# Patient Record
Sex: Female | Born: 1950 | Race: White | Hispanic: No | Marital: Married | Smoking: Former smoker
Health system: Southern US, Community
[De-identification: ages and names within clinical notes are randomized; demographics above are authoritative.]

## PROBLEM LIST (undated history)

## (undated) DIAGNOSIS — E7800 Pure hypercholesterolemia, unspecified: Secondary | ICD-10-CM

## (undated) DIAGNOSIS — I10 Essential (primary) hypertension: Secondary | ICD-10-CM

## (undated) DIAGNOSIS — T7840XA Allergy, unspecified, initial encounter: Secondary | ICD-10-CM

## (undated) HISTORY — DX: Allergy, unspecified, initial encounter: T78.40XA

## (undated) HISTORY — PX: POLYPECTOMY: SHX149

## (undated) HISTORY — PX: CHOLECYSTECTOMY: SHX55

## (undated) HISTORY — PX: ABDOMINAL HYSTERECTOMY: SHX81

---

## 1992-08-24 ENCOUNTER — Encounter (INDEPENDENT_AMBULATORY_CARE_PROVIDER_SITE_OTHER): Payer: Self-pay | Admitting: *Deleted

## 1998-11-13 ENCOUNTER — Encounter: Admission: RE | Admit: 1998-11-13 | Discharge: 1998-11-13 | Payer: Self-pay | Admitting: Family Medicine

## 2000-06-16 ENCOUNTER — Encounter: Admission: RE | Admit: 2000-06-16 | Discharge: 2000-06-16 | Payer: Self-pay | Admitting: Family Medicine

## 2000-06-27 ENCOUNTER — Encounter: Admission: RE | Admit: 2000-06-27 | Discharge: 2000-06-27 | Payer: Self-pay | Admitting: Family Medicine

## 2000-07-19 ENCOUNTER — Encounter: Admission: RE | Admit: 2000-07-19 | Discharge: 2000-07-19 | Payer: Self-pay | Admitting: Family Medicine

## 2000-10-05 ENCOUNTER — Encounter: Admission: RE | Admit: 2000-10-05 | Discharge: 2000-10-05 | Payer: Self-pay | Admitting: Family Medicine

## 2000-10-20 ENCOUNTER — Encounter: Admission: RE | Admit: 2000-10-20 | Discharge: 2000-10-20 | Payer: Self-pay | Admitting: Family Medicine

## 2001-12-19 ENCOUNTER — Encounter: Admission: RE | Admit: 2001-12-19 | Discharge: 2001-12-19 | Payer: Self-pay | Admitting: Family Medicine

## 2001-12-21 ENCOUNTER — Encounter: Admission: RE | Admit: 2001-12-21 | Discharge: 2001-12-21 | Payer: Self-pay | Admitting: Family Medicine

## 2001-12-28 ENCOUNTER — Encounter: Admission: RE | Admit: 2001-12-28 | Discharge: 2001-12-28 | Payer: Self-pay | Admitting: Family Medicine

## 2003-12-16 ENCOUNTER — Ambulatory Visit (HOSPITAL_COMMUNITY): Admission: RE | Admit: 2003-12-16 | Discharge: 2003-12-16 | Payer: Self-pay | Admitting: General Surgery

## 2003-12-16 ENCOUNTER — Encounter (INDEPENDENT_AMBULATORY_CARE_PROVIDER_SITE_OTHER): Payer: Self-pay | Admitting: *Deleted

## 2004-03-26 ENCOUNTER — Encounter: Admission: RE | Admit: 2004-03-26 | Discharge: 2004-03-26 | Payer: Self-pay | Admitting: Family Medicine

## 2004-05-07 ENCOUNTER — Encounter: Admission: RE | Admit: 2004-05-07 | Discharge: 2004-05-07 | Payer: Self-pay | Admitting: Family Medicine

## 2004-06-14 ENCOUNTER — Encounter: Admission: RE | Admit: 2004-06-14 | Discharge: 2004-06-14 | Payer: Self-pay | Admitting: Family Medicine

## 2004-06-23 ENCOUNTER — Encounter: Admission: RE | Admit: 2004-06-23 | Discharge: 2004-06-23 | Payer: Self-pay | Admitting: Sports Medicine

## 2004-06-25 ENCOUNTER — Encounter: Admission: RE | Admit: 2004-06-25 | Discharge: 2004-06-25 | Payer: Self-pay | Admitting: Sports Medicine

## 2004-08-28 ENCOUNTER — Emergency Department (HOSPITAL_COMMUNITY): Admission: EM | Admit: 2004-08-28 | Discharge: 2004-08-28 | Payer: Self-pay | Admitting: Family Medicine

## 2004-08-30 ENCOUNTER — Ambulatory Visit: Payer: Self-pay | Admitting: Family Medicine

## 2004-10-08 ENCOUNTER — Ambulatory Visit: Payer: Self-pay | Admitting: Family Medicine

## 2005-01-11 ENCOUNTER — Ambulatory Visit: Payer: Self-pay | Admitting: Family Medicine

## 2005-01-11 ENCOUNTER — Ambulatory Visit (HOSPITAL_COMMUNITY): Admission: RE | Admit: 2005-01-11 | Discharge: 2005-01-11 | Payer: Self-pay | Admitting: Family Medicine

## 2005-02-07 ENCOUNTER — Ambulatory Visit: Payer: Self-pay | Admitting: Sports Medicine

## 2005-02-07 ENCOUNTER — Encounter: Admission: RE | Admit: 2005-02-07 | Discharge: 2005-02-07 | Payer: Self-pay | Admitting: Family Medicine

## 2005-02-28 ENCOUNTER — Ambulatory Visit: Payer: Self-pay | Admitting: Family Medicine

## 2005-03-23 ENCOUNTER — Ambulatory Visit: Payer: Self-pay | Admitting: Family Medicine

## 2005-12-12 ENCOUNTER — Ambulatory Visit: Payer: Self-pay | Admitting: Family Medicine

## 2006-05-15 ENCOUNTER — Ambulatory Visit: Payer: Self-pay | Admitting: Family Medicine

## 2006-08-16 ENCOUNTER — Emergency Department (HOSPITAL_COMMUNITY): Admission: EM | Admit: 2006-08-16 | Discharge: 2006-08-17 | Payer: Self-pay | Admitting: Emergency Medicine

## 2006-12-21 DIAGNOSIS — I10 Essential (primary) hypertension: Secondary | ICD-10-CM | POA: Insufficient documentation

## 2006-12-21 DIAGNOSIS — Z87891 Personal history of nicotine dependence: Secondary | ICD-10-CM | POA: Insufficient documentation

## 2006-12-21 DIAGNOSIS — M545 Low back pain, unspecified: Secondary | ICD-10-CM | POA: Insufficient documentation

## 2006-12-21 DIAGNOSIS — J309 Allergic rhinitis, unspecified: Secondary | ICD-10-CM | POA: Insufficient documentation

## 2006-12-21 DIAGNOSIS — Z86010 Personal history of colonic polyps: Secondary | ICD-10-CM | POA: Insufficient documentation

## 2006-12-22 ENCOUNTER — Encounter (INDEPENDENT_AMBULATORY_CARE_PROVIDER_SITE_OTHER): Payer: Self-pay | Admitting: *Deleted

## 2007-02-28 ENCOUNTER — Telehealth: Payer: Self-pay | Admitting: Family Medicine

## 2007-05-28 ENCOUNTER — Ambulatory Visit: Payer: Self-pay | Admitting: Sports Medicine

## 2007-05-28 ENCOUNTER — Encounter: Payer: Self-pay | Admitting: Family Medicine

## 2007-05-28 DIAGNOSIS — L821 Other seborrheic keratosis: Secondary | ICD-10-CM | POA: Insufficient documentation

## 2007-05-28 DIAGNOSIS — R1013 Epigastric pain: Secondary | ICD-10-CM | POA: Insufficient documentation

## 2007-05-28 LAB — CONVERTED CEMR LAB
AST: 16 units/L (ref 0–37)
Alkaline Phosphatase: 66 units/L (ref 39–117)
BUN: 9 mg/dL (ref 6–23)
Creatinine, Ser: 0.85 mg/dL (ref 0.40–1.20)
HCT: 48.8 % — ABNORMAL HIGH (ref 36.0–46.0)
HDL: 38 mg/dL — ABNORMAL LOW (ref >39)
Hemoglobin: 16.3 g/dL — ABNORMAL HIGH (ref 12.0–15.0)
LDL Cholesterol: 105 mg/dL — ABNORMAL HIGH (ref 0–99)
MCHC: 33.4 g/dL (ref 30.0–36.0)
RDW: 14.9 % — ABNORMAL HIGH (ref 11.5–14.0)
Total CHOL/HDL Ratio: 5.1

## 2007-05-30 ENCOUNTER — Encounter: Admission: RE | Admit: 2007-05-30 | Discharge: 2007-05-30 | Payer: Self-pay | Admitting: Family Medicine

## 2007-05-30 ENCOUNTER — Telehealth: Payer: Self-pay | Admitting: Family Medicine

## 2007-06-11 ENCOUNTER — Telehealth: Payer: Self-pay | Admitting: Family Medicine

## 2007-07-27 ENCOUNTER — Ambulatory Visit: Payer: Self-pay | Admitting: Family Medicine

## 2007-09-12 ENCOUNTER — Telehealth: Payer: Self-pay | Admitting: *Deleted

## 2007-09-12 ENCOUNTER — Ambulatory Visit: Payer: Self-pay | Admitting: Family Medicine

## 2007-09-18 ENCOUNTER — Ambulatory Visit: Payer: Self-pay | Admitting: Family Medicine

## 2007-10-19 ENCOUNTER — Ambulatory Visit: Payer: Self-pay | Admitting: Family Medicine

## 2007-10-19 DIAGNOSIS — M5412 Radiculopathy, cervical region: Secondary | ICD-10-CM | POA: Insufficient documentation

## 2008-06-27 ENCOUNTER — Ambulatory Visit: Payer: Self-pay | Admitting: Family Medicine

## 2008-06-27 DIAGNOSIS — J449 Chronic obstructive pulmonary disease, unspecified: Secondary | ICD-10-CM | POA: Insufficient documentation

## 2008-06-27 LAB — CONVERTED CEMR LAB
Calcium: 9.4 mg/dL (ref 8.4–10.5)
Sodium: 140 meq/L (ref 135–145)

## 2008-07-22 ENCOUNTER — Telehealth: Payer: Self-pay | Admitting: *Deleted

## 2008-07-30 ENCOUNTER — Ambulatory Visit: Payer: Self-pay | Admitting: Family Medicine

## 2008-08-01 ENCOUNTER — Encounter: Payer: Self-pay | Admitting: *Deleted

## 2009-06-24 ENCOUNTER — Ambulatory Visit: Payer: Self-pay | Admitting: Family Medicine

## 2009-06-24 DIAGNOSIS — R5381 Other malaise: Secondary | ICD-10-CM | POA: Insufficient documentation

## 2009-06-24 DIAGNOSIS — R5383 Other fatigue: Secondary | ICD-10-CM

## 2009-06-24 LAB — CONVERTED CEMR LAB
ALT: 9 units/L (ref 0–35)
Albumin: 4.1 g/dL (ref 3.5–5.2)
BUN: 12 mg/dL (ref 6–23)
CO2: 24 meq/L (ref 19–32)
Calcium: 9.3 mg/dL (ref 8.4–10.5)
Chloride: 103 meq/L (ref 96–112)
Creatinine, Ser: 0.97 mg/dL (ref 0.40–1.20)
HCT: 46.9 % — ABNORMAL HIGH (ref 36.0–46.0)
Hemoglobin: 15.6 g/dL — ABNORMAL HIGH (ref 12.0–15.0)
Platelets: 257 10*3/uL (ref 150–400)
TSH: 0.889 microintl units/mL (ref 0.350–4.500)
WBC: 8.4 10*3/uL (ref 4.0–10.5)

## 2009-06-30 ENCOUNTER — Encounter: Admission: RE | Admit: 2009-06-30 | Discharge: 2009-06-30 | Payer: Self-pay | Admitting: Family Medicine

## 2009-07-03 ENCOUNTER — Ambulatory Visit: Payer: Self-pay | Admitting: Internal Medicine

## 2009-07-08 ENCOUNTER — Telehealth: Payer: Self-pay | Admitting: Internal Medicine

## 2009-07-16 ENCOUNTER — Ambulatory Visit: Payer: Self-pay | Admitting: Internal Medicine

## 2009-07-16 ENCOUNTER — Encounter: Payer: Self-pay | Admitting: Internal Medicine

## 2009-07-18 ENCOUNTER — Encounter: Payer: Self-pay | Admitting: Internal Medicine

## 2009-11-30 ENCOUNTER — Telehealth: Payer: Self-pay | Admitting: Internal Medicine

## 2009-11-30 ENCOUNTER — Telehealth: Payer: Self-pay | Admitting: Family Medicine

## 2009-12-14 ENCOUNTER — Telehealth: Payer: Self-pay | Admitting: Family Medicine

## 2009-12-31 ENCOUNTER — Telehealth: Payer: Self-pay | Admitting: Family Medicine

## 2009-12-31 ENCOUNTER — Ambulatory Visit: Payer: Self-pay | Admitting: Family Medicine

## 2010-05-07 ENCOUNTER — Ambulatory Visit (HOSPITAL_COMMUNITY): Admission: RE | Admit: 2010-05-07 | Discharge: 2010-05-07 | Payer: Self-pay | Admitting: Family Medicine

## 2010-05-07 ENCOUNTER — Ambulatory Visit: Payer: Self-pay | Admitting: Family Medicine

## 2010-05-07 LAB — CONVERTED CEMR LAB
ALT: 16 units/L (ref 0–35)
AST: 19 units/L (ref 0–37)
Albumin: 4.2 g/dL (ref 3.5–5.2)
BUN: 10 mg/dL (ref 6–23)
Calcium: 9.7 mg/dL (ref 8.4–10.5)
Chloride: 104 meq/L (ref 96–112)
Hemoglobin: 15.5 g/dL — ABNORMAL HIGH (ref 12.0–15.0)
LDL Cholesterol: 97 mg/dL (ref 0–99)
Potassium: 3.8 meq/L (ref 3.5–5.3)
RDW: 14 % (ref 11.5–15.5)
Sodium: 141 meq/L (ref 135–145)
TSH: 0.752 microintl units/mL (ref 0.350–4.500)
Total Protein: 7.1 g/dL (ref 6.0–8.3)

## 2010-05-19 ENCOUNTER — Encounter: Payer: Self-pay | Admitting: Family Medicine

## 2010-11-13 ENCOUNTER — Encounter: Payer: Self-pay | Admitting: Cardiovascular Disease

## 2010-11-23 NOTE — Progress Notes (Signed)
Summary: meds prob  Phone Note Call from Patient Call back at Home Phone [phone]   Caller: Patient Summary of Call: Enalap/hctz 5-12 5mg  tabs - was told that manufacturer has discontinued this med and needs to see what else she needs to take.  [facility] Initial call taken by: De Nurse,  December 14, 2009 8:53 AM  Follow-up for Phone Call        New Rx sent and patient informed Follow-up by: Doralee Albino MD,  December 14, 2009 11:04 AM    New/Updated Medications: ENALAPRIL MALEATE 10 MG TABS (ENALAPRIL MALEATE) one by mouth daily Prescriptions: ENALAPRIL MALEATE 10 MG TABS (ENALAPRIL MALEATE) one by mouth daily  #90 x 3   Entered and Authorized by:   Doralee Albino MD   Signed by:   Doralee Albino MD on 12/14/2009   Method used:   Electronically to        [facility] (retail)       [address]       [place], [place]  [address]       Ph: [phone]       Fax: [phone]   RxID:   [id number]

## 2010-11-23 NOTE — Progress Notes (Signed)
Summary: triage  Phone Note Call from Patient Call back at Home Phone [phone]   Caller: Patient Summary of Call: having stomach issues/pain - needs to talk to nurse Initial call taken by: De Nurse,  November 30, 2009 1:38 PM  Follow-up for Phone Call        c/o being uncomfortable "near my gallbladder, but it has been taken out" had colonoscopy by Dr. Juanda Chance. had 4 polyps removed. off & on  pain on R. had this problem when she had the GB surgery. happens when she moves a certain way & sometimes when she is sitting still. wants to see md today. told her we had no appts. she is going to call Dr. Juanda Chance & if no appt there will go to the [facility] she has used before  had colonoscopy 9/10 by Dr. Juanda Chance. had 4 polyps removed. Follow-up by: Golden Circle RN,  November 30, 2009 2:00 PM  Additional Follow-up for Phone Call Additional follow up Details #1::        noted and agree Additional Follow-up by: Doralee Albino MD,  December 01, 2009 9:18 AM

## 2010-11-23 NOTE — Assessment & Plan Note (Signed)
Summary: leg cramps and dry cough and HTN   Vital Signs:  Patient profile:   60 year old female Height:      68.5 inches Weight:      189 pounds BMI:     28.42 Temp:     98.2 degrees F Pulse rate:   56 / minute BP sitting:   160 / 92  Vitals Entered By: Golden Circle RN (December 31, 2009 1:31 PM)  Primary Care Provider:  Doralee Albino MD  CC:  leg cramps and cough.  History of Present Illness: Leg Cramps: Pt has had 6 days and nights of leg cramps. It started about 8-9 days after starting Enalipril 10 mg. She had been on Enalapril/HCTZ 5-12.5 prior to this and had been doing well. However now with the higher dose the patient feels like she is having leg cramps and swollen ankles.   Cough: Pt has a grandson that has been visiting her lately and was sick. She now has had a dry cough for more than 1 week and has occaional left ear shooting pain. She had a sore throat and a runny nose (now both resolved) but continues to have a dry cough. The dry cough developed about the same time the leg cramps did and she wonders if it is also related to the increase in Enalapril.  hypertension: Pt has elevated BP today. She is taking the Metoprolol but is not taking the Enalapril today. It had been well controlled before she switched meds.   Habits & Providers  Alcohol-Tobacco-Diet     Alcohol drinks/day: 0     Tobacco Status: current     Tobacco Counseling: to quit use of tobacco products     Cigarette Packs/Day: 0.5     Year Started: age 34  Exercise-Depression-Behavior     Does Patient Exercise: yes     Type of exercise: walk     Times/week: 7     Have you felt down or hopeless? no     Have you felt little pleasure in things? no     Depression Counseling: not indicated; screening negative for depression     Drug Use: never     Seat Belt Use: always  Current Medications (verified): 1)  Bayer Aspirin 325 Mg Tabs (Aspirin) .... Take 1 Tablet By Mouth Once A Day 2)  Lorazepam 1 Mg  Tabs (Lorazepam) .... One By Mouth At Bedtime As Needed Neck Pain/spasm 3)  Metoprolol Tartrate 50 Mg Tabs (Metoprolol Tartrate) .... Take 1 Tablet By Mouth Twice A Day 4)  Sm Calcium/vitamin D 500-200 Mg-Unit Tabs (Calcium Carbonate-Vitamin D) .... Take 1 Tablet By Mouth Three Times A Day 5)  Guaifenesin-Codeine 100-10 Mg/47ml Syrp (Guaifenesin-Codeine) .... One Teaspoon Q4h As Needed Cough.  Disp 8 Oz 6)  Enalapril-Hydrochlorothiazide 5-12.5 Mg Tabs (Enalapril-Hydrochlorothiazide) .... Take One Pill Daily For High Blood Pressure  Allergies (verified): 1)  ! Amoxicillin 2)  ! Metronidazole (Metronidazole) 3)  ! Famotidine (Famotidine)  Social History: Does Patient Exercise:  yes Packs/Day:  0.5  Review of Systems        vitals reviewed and pertinent negatives and positives seen in HPI   Physical Exam  General:  Well-developed,well-nourished,in no acute distress but does not appear as if she feels well; alert,appropriate and cooperative throughout examination Head:  Normocephalic and atraumatic without obvious abnormalities. No apparent alopecia or balding. Ears:  left ear has some purulance behind it but has normal light reflex and no bulging. Rt ear has normal  TM. No erythema in canal.  Nose:  External nasal examination shows no deformity or inflammation. Nasal mucosa are pink and moist without lesions or exudates. Mouth:  some erythema in posterior pharynx, no purulance Neck:  No deformities, masses, or tenderness noted. No LAD Lungs:  Normal respiratory effort, chest expands symmetrically. Lungs are clear to auscultation, no crackles or wheezes. Heart:  Normal rate and regular rhythm. S1 and S2 normal without gallop, murmur, click, rub or other extra sounds. Extremities:  trace edema in bilateral lower legs.  Cervical Nodes:  No lymphadenopathy noted   Impression & Recommendations:  Problem # 1:  LEG CRAMPS (ICD-729.82) Assessment New Pt believes that her cramps are due to  her increased dose of Enalipril. She had been on Enalapril for a while and did have an increase in dose that seh started taking on 12-15-09. She has been having leg cramps for the last 6 days. The Rt leg cramps more than the left. It happens more at night than during the day.   Orders: [facility]- Est  Level 4 (14782)  Problem # 2:  DRY COUGH (ICD-786.2) Assessment: New Pt has had a dry cough for over a week now. She is concerned that it is also being caused by the ACE-I. She has also had a viral illness lately (probably contracted from her grandson). Plan to treat cough with Guaifenesin-Codeine and put her back on her old dose on Enalapril 5 mg. It was not causing any cough. If pt's cough persists RTC to see PCP.   Orders: [facility]- Est  Level 4 (95621)  Problem # 3:  HYPERTENSION, BENIGN SYSTEMIC (ICD-401.1) Assessment: Deteriorated Pt was placed back on her old regimine of BP meds. She has elevated BP today in the clinic but this is expected since she is only taking Metoprolol.   The following medications were removed from the medication list:    Enalapril Maleate 10 Mg Tabs (Enalapril maleate) ..... One by mouth daily Her updated medication list for this problem includes:    Metoprolol Tartrate 50 Mg Tabs (Metoprolol tartrate) .[facility]... Take 1 tablet by mouth twice a day    Enalapril-hydrochlorothiazide 5-12.5 Mg Tabs (Enalapril-hydrochlorothiazide) .Marland Kitchen... Take one pill daily for high blood pressure  Orders: [facility]- Est  Level 4 ([address]214)  Complete Medication List: 1)  Bayer Aspirin 325 Mg Tabs (Aspirin) .... Take 1 tablet by mouth once a day 2)  Lorazepam 1 Mg Tabs (Lorazepam) .... One by mouth at bedtime as needed neck pain/spasm 3)  Metoprolol Tartrate 50 Mg Tabs (Metoprolol tartrate) .... Take 1 tablet by mouth twice a day 4)  Sm Calcium/vitamin D 500-200 Mg-unit Tabs (Calcium carbonate-vitamin d) .... Take 1 tablet by mouth three times a day 5)  Guaifenesin-codeine 100-10 Mg/79ml Syrp  (Guaifenesin-codeine) .... One teaspoon q4h as needed cough.  disp 8 oz 6)  Enalapril-hydrochlorothiazide 5-12.5 Mg Tabs (Enalapril-hydrochlorothiazide) .... Take one pill daily for high blood pressure  Patient Instructions: 1)  Use the Guafineson and Codeine for your dry cough.  2)  Stop the Enalipril 10 mg, and restart your prior medication.  3)  Drink plenty of water and keep up the good work with your smoking decrease.  Prescriptions: ENALAPRIL-HYDROCHLOROTHIAZIDE 5-12.5 MG TABS (ENALAPRIL-HYDROCHLOROTHIAZIDE) take one pill daily for high blood pressure  #31 x 11   Entered and Authorized by:   Jamie Brookes MD   Signed by:   Jamie Brookes MD on 12/31/2009   Method used:   Electronically to  [facility] ([facility]       [address]       [place], [place]  [address]       Ph: [phone]       Fax: [phone]   RxID:   [id number] GUAIFENESIN-CODEINE 100-10 MG/5ML SYRP (GUAIFENESIN-CODEINE) one teaspoon q4h as needed cough.  Disp 8 oz  #8 x 1   Entered and Authorized by:   Jamie Brookes MD   Signed by:   Jamie Brookes MD on 12/31/2009   Method used:   Print then Give to Patient   RxID:   [id number]

## 2010-11-23 NOTE — Progress Notes (Signed)
Summary: TRIAGE  Phone Note Call from Patient Call back at Home Phone [phone]   Caller: Patient Call For: Jozi Malachi Reason for Call: Talk to Nurse Summary of Call: Patient states that she has severe abd pain, wants to be seen today  Initial call taken by: Tawni Levy,  November 30, 2009 2:09 PM  Follow-up for Phone Call        Pt. c/o epigastric pain, intermittent for 1 or more years, is getting worse today, "It just feels like there is something there"  Denies n/v, fever, constipation, diarrhea, bleeding or black stools.   1) See Amy Esterwood PAC on 12-01-09 at 9:30am 2) Soft,bland diet. No spicy,greasy,fried foods. 3) tylenol/Ibuprofen as needed 4) If symptoms become worse call back immediately or go to ER.  Follow-up by: Laureen Ochs LPN,  November 30, 2009 2:45 PM

## 2010-11-23 NOTE — Assessment & Plan Note (Signed)
Summary: cpe,df   Vital Signs:  Patient profile:   60 year old female Height:      69 inches Weight:      184 pounds BMI:     27.27 Temp:     97.7 degrees F oral Pulse rate:   60 / minute BP sitting:   114 / 77  (left arm) Cuff size:   regular  Vitals Entered By: Loralee Pacas CMA (May 07, 2010 3:28 PM)  Primary Care Provider:  Doralee Albino MD   History of Present Illness: Removed amox from allergy list.  Initially put on as a possible allergy.  Recently given augmentin and took without problem No complaints. Continues to smoke.  Mildly interested in quiting. No exercise but active in daily life.    Habits & Providers  Alcohol-Tobacco-Diet     Alcohol drinks/day: <1     Tobacco Status: current     Tobacco Counseling: to quit use of tobacco products  Exercise-Depression-Behavior     Does Patient Exercise: no     Exercise Counseling: to improve exercise regimen     STD Risk: never     Drug Use: never  Current Medications (verified): 1)  Aspir-Low 81 Mg Tbec (Aspirin) .... One Daily Otc 2)  Lorazepam 1 Mg Tabs (Lorazepam) .... One By Mouth At Bedtime As Needed Neck Pain/spasm 3)  Metoprolol Tartrate 50 Mg Tabs (Metoprolol Tartrate) .... Take 1 Tablet By Mouth Twice A Day 4)  Sm Calcium/vitamin D 500-200 Mg-Unit Tabs (Calcium Carbonate-Vitamin D) .... Take 1 Tablet By Mouth Three Times A Day 5)  Lisinopril-Hydrochlorothiazide 10-12.5 Mg Tabs (Lisinopril-Hydrochlorothiazide) .... One By Mouth Daily  Allergies: 1)  ! Metronidazole (Metronidazole) 2)  ! Famotidine (Famotidine)  Past History:  Past medical, surgical, family and social histories (including risk factors) reviewed, and no changes noted (except as noted below).  Past Medical History: Reviewed history from 06/27/2008 and no changes required. probable COPD by PFT`s 08/01, recurrent yeast vaginitis Had pneumovax 10/20/00  Past Surgical History: Reviewed history from 12/21/2006 and no changes  required. Cardiolite entirely normal - 09/11/2003, Cholecystectomy - 11/25/2003, hysterectomy and one ovary removed - 06/16/2000  Family History: Reviewed history from 12/21/2006 and no changes required. - CA, + COPD, Altzheimers, DM, CAD,  ETOHism, depression  Social History: Reviewed history from 05/28/2007 and no changes required. smoke since age 46, now 1/2 ppd or less; ETOH rare; work Scientist, physiological; lives with husband and 75 year old sonDoes Patient Exercise:  no  Review of Systems  The patient denies hoarseness, chest pain, dyspnea on exertion, peripheral edema, prolonged cough, abdominal pain, melena, hematuria, and unusual weight change.    Physical Exam  General:  Well-developed,well-nourished,in no acute distress; alert,appropriate and cooperative throughout examination Head:  Normocephalic and atraumatic without obvious abnormalities. No apparent alopecia or balding. Eyes:  No corneal or conjunctival inflammation noted. EOMI. Perrla. Funduscopic exam benign, without hemorrhages, exudates or papilledema. Vision grossly normal. Ears:  External ear exam shows no significant lesions or deformities.  Otoscopic examination reveals clear canals, tympanic membranes are intact bilaterally without bulging, retraction, inflammation or discharge. Hearing is grossly normal bilaterally. Mouth:  Oral mucosa and oropharynx without lesions or exudates.  Teeth in good repair. Neck:  No deformities, masses, or tenderness noted. Lungs:  Normal respiratory effort, chest expands symmetrically. Lungs are clear to auscultation, no crackles or wheezes. Heart:  Normal rate and regular rhythm. S1 and S2 normal without gallop, murmur, click, rub or other extra sounds. Abdomen:  Bowel  sounds positive,abdomen soft and non-tender without masses, organomegaly or hernias noted. Msk:  No deformity or scoliosis noted of thoracic or lumbar spine.   Extremities:  No clubbing, cyanosis, edema, or deformity  noted with normal full range of motion of all joints.   Neurologic:  No cranial nerve deficits noted. Station and gait are normal. Plantar reflexes are down-going bilaterally. DTRs are symmetrical throughout. Sensory, motor and coordinative functions appear intact.   Impression & Recommendations:  Problem # 1:  EXAMINATION, ROUTINE MEDICAL (ICD-V70.0)  Healthy, main issue is needs to quit smoking. Needs lipids.  Orders: [facility] - Est  40-64 yrs ([address])  Problem # 2:  HYPERTENSION, BENIGN SYSTEMIC (ICD-401.1)  Well controled Her updated medication list for this problem includes:    Metoprolol Tartrate 50 Mg Tabs (Metoprolol tartrate) .Marland Kitchen... Take 1 tablet by mouth twice a day    Lisinopril-hydrochlorothiazide 10-12.5 Mg Tabs (Lisinopril-hydrochlorothiazide) ..... One by mouth daily  Orders: Comp Met-[facility] [phone]) Lipid-[facility] [phone]) CBC-[facility] ([address]) TSH-[facility] [phone]) 12 Lead EKG (12 Lead EKG)  BP today: 114/77 Prior BP: 160/92 (12/31/2009)  Labs Reviewed: K+: 4.3 (06/24/2009) Creat: : 0.97 (06/24/2009)   Chol: 194 (05/28/2007)   HDL: 38 (05/28/2007)   LDL: 105 (05/28/2007)   TG: 254 (05/28/2007)  Problem # 3:  COPD (ICD-496) Mild - advised to quit smoking  Complete Medication List: 1)  Aspir-low 81 Mg Tbec (Aspirin) .... One daily otc 2)  Lorazepam 1 Mg Tabs (Lorazepam) .... One by mouth at bedtime as needed neck pain/spasm 3)  Metoprolol Tartrate 50 Mg Tabs (Metoprolol tartrate) .... Take 1 tablet by mouth twice a day 4)  Sm Calcium/vitamin D 500-200 Mg-unit Tabs (Calcium carbonate-vitamin d) .... Take 1 tablet by mouth three times a day 5)  Lisinopril-hydrochlorothiazide 10-12.5 Mg Tabs (Lisinopril-hydrochlorothiazide) .... One by mouth daily   Prevention & Chronic Care Immunizations   Influenza vaccine: Fluvax Non-MCR  (08/01/2008)   Influenza vaccine due: 07/26/2008    Tetanus booster: 10/24/2002: Done.   Tetanus booster due: 10/24/2012    Pneumococcal  vaccine: Pneumovax  (06/27/2008)   Pneumococcal vaccine due: None  Colorectal Screening   Hemoccult: Done.  (05/24/2004)   Hemoccult due: Not Indicated    Colonoscopy: Location:  [facility].    (07/16/2009)   Colonoscopy action/deferral: GI Referral  (06/24/2009)   Colonoscopy due: 07/2014  Other Screening   Pap smear: Done.  (08/24/1992)   Pap smear due: Not Indicated    Mammogram: ASSESSMENT: Negative - BI-RADS 1^MM DIGITAL SCREENING  (06/30/2009)   Mammogram action/deferral: Ordered  (06/24/2009)   Mammogram due: 06/2010   Smoking status: current  (05/07/2010)   Smoking cessation counseling: yes  (06/27/2008)  Lipids   Total Cholesterol: 194  (05/28/2007)   LDL: 105  (05/28/2007)   LDL Direct: Not documented   HDL: 38  (05/28/2007)   Triglycerides: 254  (05/28/2007)  Hypertension   Last Blood Pressure: 114 / 77  (05/07/2010)   Serum creatinine: 0.97  (06/24/2009)   Serum potassium 4.3  (06/24/2009) CMP ordered     Hypertension flowsheet reviewed?: Yes   Progress toward BP goal: At goal  Self-Management Support :   Personal Goals (by the next clinic visit) :      Personal blood pressure goal: 140/90  (06/24/2009)   Hypertension self-management support: Written self-care plan  (06/24/2009)

## 2010-11-23 NOTE — Progress Notes (Signed)
Summary: meds prob  Phone Note Call from Patient Call back at Home Phone [phone]   Caller: Patient Summary of Call: having real bad leg cramps/legs swollen/bad cough - thinks it is coming form the Enalapril Initial call taken by: De Nurse,  December 31, 2009 10:23 AM  Follow-up for Phone Call        she will see Dr. Clotilde Dieter at 1:30 today. understands that pcp is not available. she is in discomfort & wants to be seen today. asked her to bring all med bottles with her Follow-up by: Golden Circle RN,  December 31, 2009 10:34 AM  Additional Follow-up for Phone Call Additional follow up Details #1::        noted and agree Additional Follow-up by: Doralee Albino MD,  December 31, 2009 10:47 AM

## 2010-12-20 ENCOUNTER — Other Ambulatory Visit: Payer: Self-pay | Admitting: Family Medicine

## 2010-12-20 MED ORDER — LISINOPRIL-HYDROCHLOROTHIAZIDE 10-12.5 MG PO TABS: 1.0000 | ORAL_TABLET | Freq: Every day | ORAL | Status: DC

## 2011-01-06 ENCOUNTER — Telehealth: Payer: Self-pay | Admitting: Family Medicine

## 2011-01-06 ENCOUNTER — Other Ambulatory Visit: Payer: Self-pay | Admitting: Family Medicine

## 2011-01-06 NOTE — Telephone Encounter (Signed)
Andrea Robles need refill for her Lorazepam 1 mg.

## 2011-01-06 NOTE — Telephone Encounter (Signed)
Dr. Leveda Anna wrote RX on prescription pad and RN called pharmacy and  left verbally on pharmacy voicemail and shredded the original RX.

## 2011-01-06 NOTE — Telephone Encounter (Signed)
Refill request

## 2011-03-11 NOTE — Op Note (Signed)
NAME:  Andrea, Robles                          ACCOUNT NO.:  [id number]   MEDICAL RECORD NO.:  [id number]                   PATIENT TYPE:  AMB   LOCATION:  DAY                                  FACILITY:  [facility]   PHYSICIAN:  Gita Kudo, M.D.              DATE OF BIRTH:  17-May-1951   DATE OF PROCEDURE:  12/16/2003  DATE OF DISCHARGE:                                 OPERATIVE REPORT   OPERATION/PROCEDURE:  Laparoscopic cholecystectomy with intraoperative  cholangiogram.   SURGEON:  Gita Kudo, M.D.   ASSISTANT:  Anselm Pancoast. Zachery Dakins, M.D.   ANESTHESIA:  General endotracheal anesthesia.   PREOPERATIVE DIAGNOSIS:  Cholecystitis, gallstones.   POSTOPERATIVE DIAGNOSIS:  Cholecystitis, gallstones, normal cholangiogram.   CLINICAL SUMMARY:  A 60 year old woman with bouts of abdominal pain  Gallbladder ultrasound shows stones.  Liver functions normal.  She is  brought in for elective surgery.   OPERATIVE FINDINGS:  The patient had some lower abdominal adhesions from  pelvic surgery.  The gallbladder itself was  thin-walled.  The cystic duct  and artery were normal in size and anatomy.  The gallbladder did have some  dense adhesions around it that were taken down sharply.   DESCRIPTION OF PROCEDURE:  Under satisfactory general endotracheal  anesthesia, having received 1 g Ancef preoperatively, the patient's abdomen  was positioned, prepped and draped in the standard fashion.  A total of 30  mL of 0.5% Marcaine was infiltrated into the four incisions before they were  made.  Transverse incision was made above the umbilicus.  Midline opened  into the peritoneum and controlled with a figure-of-eight 0 Vicryl suture.  Operating Hasson port inserted, good CO2 pneumoperitoneum was established  and camera placed.  Under direct vision, two #5 ports placed laterally and a  second #10 medially without incident.  Then the gallbladder was identified  and the adhesions taken down with  the dissector using current and  occasionally scissor.  When the gallbladder was free, the exposure was  excellent and the cystic duct identified.  It was circumferentially  dissected and when certain of the anatomy, a clip placed near the  gallbladder and an incision made.  Percutaneous catheter placed and used to  get good cholangiogram.  The catheter then withdrawn and the duct controlled  with multiple clips distally and divided.   Similarly, the cystic artery was identified, dissected, controlled with  multiple clips and divided.  Gallbladder then removed from below upward  using coagulating spatula for hemostasis and dissection.  After the  gallbladder was removed, the liver bed was checked for hemostasis with  cautery, lavaged with saline and suctioned dry.   Camera then moved to the upper port and through the lower port, a large  grasper passed to retrieve the gallbladder intact and without complications.  The operative site was again checked for hemostasis, lavaged with saline,  suctioned dry.  Then the  CO2 was released, ports were removed under vision  and then the abdomen closed.  The midline was closed with a previous figure-  of-eight and a second interrupted 0 Vicryl.  Subcutaneous tissue  approximated with 4-0 Vicryl and Steri-Strips used to approximate all  incisions.  There were no complications and the sponge and needle counts  were correct.  She tolerated the procedure well and went to the recovery  room in good condition for possible discharge today versus tomorrow morning.                                               Gita Kudo, M.D.    MRL/MEDQ  D:  12/16/2003  T:  12/16/2003  Job:  [id number]   cc:   William A. Leveda Anna, M.D.  Fax: [phone]

## 2011-03-20 ENCOUNTER — Other Ambulatory Visit: Payer: Self-pay | Admitting: Family Medicine

## 2011-03-21 NOTE — Telephone Encounter (Signed)
Refill request

## 2011-05-11 ENCOUNTER — Encounter: Payer: Self-pay | Admitting: Family Medicine

## 2011-05-11 ENCOUNTER — Ambulatory Visit (INDEPENDENT_AMBULATORY_CARE_PROVIDER_SITE_OTHER): Payer: PRIVATE HEALTH INSURANCE | Admitting: Family Medicine

## 2011-05-11 VITALS — BP 121/78 | HR 64 | Temp 98.1°F | Ht 69.0 in | Wt 183.0 lb

## 2011-05-11 DIAGNOSIS — I10 Essential (primary) hypertension: Secondary | ICD-10-CM

## 2011-05-11 DIAGNOSIS — Z0000 Encounter for general adult medical examination without abnormal findings: Secondary | ICD-10-CM

## 2011-05-11 DIAGNOSIS — F17200 Nicotine dependence, unspecified, uncomplicated: Secondary | ICD-10-CM

## 2011-05-11 DIAGNOSIS — J449 Chronic obstructive pulmonary disease, unspecified: Secondary | ICD-10-CM

## 2011-05-11 DIAGNOSIS — N898 Other specified noninflammatory disorders of vagina: Secondary | ICD-10-CM

## 2011-05-11 DIAGNOSIS — K621 Rectal polyp: Secondary | ICD-10-CM

## 2011-05-11 DIAGNOSIS — Z1231 Encounter for screening mammogram for malignant neoplasm of breast: Secondary | ICD-10-CM

## 2011-05-11 DIAGNOSIS — Z1239 Encounter for other screening for malignant neoplasm of breast: Secondary | ICD-10-CM

## 2011-05-11 DIAGNOSIS — K620 Anal polyp: Secondary | ICD-10-CM

## 2011-05-11 LAB — LIPID PANEL
LDL Cholesterol: 118 mg/dL — ABNORMAL HIGH (ref 0–99)
VLDL: 33 mg/dL (ref 0–40)

## 2011-05-11 LAB — POCT WET PREP (WET MOUNT): Clue Cells Wet Prep HPF POC: NEGATIVE

## 2011-05-11 MED ORDER — ENALAPRIL-HYDROCHLOROTHIAZIDE 5-12.5 MG PO TABS: 1.0000 | ORAL_TABLET | Freq: Every day | ORAL | Status: DC

## 2011-05-11 MED ORDER — BUPROPION HCL 75 MG PO TABS: 75.0000 mg | ORAL_TABLET | Freq: Two times a day (BID) | ORAL | Status: DC

## 2011-05-11 NOTE — Progress Notes (Signed)
  Subjective:    Patient ID: Andrea Robles, female    DOB: June 02, 1951, 60 y.o.   MRN: [id number]  HPI Here for annual exam.  Doing quite well.  No complaints.  Taking meds without difficulty and BP controled.  Not on meds for COPD but still smokes.  Has tried Chantix and nicotine replacement in past.  Wants to try wellbutrin.  Mild DOE      Review of Systems No chest pain, abd pain, unusual bleeding, leg swelling or weight change.     Objective:   Physical Exam HEENT nl Neck, supple without masses Lungs clear, mild prolonged exp phase Cardiac RRR Abd benign Ext without edema.        Assessment & Plan:

## 2011-05-12 LAB — COMPLETE METABOLIC PANEL WITH GFR
ALT: 10 U/L (ref 0–35)
AST: 15 U/L (ref 0–37)
CO2: 26 mEq/L (ref 19–32)
Calcium: 9.2 mg/dL (ref 8.4–10.5)
Chloride: 101 mEq/L (ref 96–112)
GFR, Est African American: >60 mL/min (ref >60)
Potassium: 3.5 mEq/L (ref 3.5–5.3)
Sodium: 139 mEq/L (ref 135–145)
Total Protein: 7.2 g/dL (ref 6.0–8.3)

## 2011-05-13 DIAGNOSIS — Z0000 Encounter for general adult medical examination without abnormal findings: Secondary | ICD-10-CM | POA: Insufficient documentation

## 2011-05-13 NOTE — Assessment & Plan Note (Signed)
Generally healthy.  Up to date on screening.  Followed by GI for colon polyps.  Biggest issue by far is tobacco abuse.

## 2011-05-13 NOTE — Assessment & Plan Note (Signed)
Smoking cessation  

## 2011-05-13 NOTE — Assessment & Plan Note (Signed)
Followed by GI

## 2011-05-13 NOTE — Assessment & Plan Note (Signed)
Complained of odor, wet prep OK

## 2011-05-13 NOTE — Assessment & Plan Note (Signed)
Counciled and wellbutrin

## 2011-05-13 NOTE — Assessment & Plan Note (Signed)
Well controled. 

## 2011-05-19 ENCOUNTER — Ambulatory Visit
Admission: RE | Admit: 2011-05-19 | Discharge: 2011-05-19 | Disposition: A | Discharge status: Home / Self Care | Payer: PRIVATE HEALTH INSURANCE | Source: Ambulatory Visit | Attending: Family Medicine | Admitting: Family Medicine

## 2011-05-19 DIAGNOSIS — Z1231 Encounter for screening mammogram for malignant neoplasm of breast: Secondary | ICD-10-CM

## 2011-06-16 ENCOUNTER — Other Ambulatory Visit: Payer: Self-pay | Admitting: Family Medicine

## 2011-06-16 NOTE — Telephone Encounter (Signed)
Refill request

## 2012-03-15 ENCOUNTER — Other Ambulatory Visit: Payer: Self-pay | Admitting: Family Medicine

## 2012-04-11 ENCOUNTER — Encounter: Payer: Self-pay | Admitting: Family Medicine

## 2012-04-11 ENCOUNTER — Ambulatory Visit (INDEPENDENT_AMBULATORY_CARE_PROVIDER_SITE_OTHER): Payer: Self-pay | Admitting: Family Medicine

## 2012-04-11 VITALS — BP 134/83 | HR 56 | Temp 97.8°F | Ht 69.0 in | Wt 180.0 lb

## 2012-04-11 DIAGNOSIS — Z13820 Encounter for screening for osteoporosis: Secondary | ICD-10-CM

## 2012-04-11 DIAGNOSIS — I10 Essential (primary) hypertension: Secondary | ICD-10-CM

## 2012-04-11 DIAGNOSIS — J449 Chronic obstructive pulmonary disease, unspecified: Secondary | ICD-10-CM

## 2012-04-11 DIAGNOSIS — F17200 Nicotine dependence, unspecified, uncomplicated: Secondary | ICD-10-CM

## 2012-04-11 DIAGNOSIS — R7303 Prediabetes: Secondary | ICD-10-CM | POA: Insufficient documentation

## 2012-04-11 DIAGNOSIS — R739 Hyperglycemia, unspecified: Secondary | ICD-10-CM

## 2012-04-11 DIAGNOSIS — R7309 Other abnormal glucose: Secondary | ICD-10-CM

## 2012-04-11 DIAGNOSIS — M2550 Pain in unspecified joint: Secondary | ICD-10-CM

## 2012-04-11 DIAGNOSIS — K029 Dental caries, unspecified: Secondary | ICD-10-CM

## 2012-04-11 DIAGNOSIS — Z114 Encounter for screening for human immunodeficiency virus [HIV]: Secondary | ICD-10-CM | POA: Insufficient documentation

## 2012-04-11 MED ORDER — ENALAPRIL-HYDROCHLOROTHIAZIDE 5-12.5 MG PO TABS: 1.0000 | ORAL_TABLET | Freq: Every day | ORAL | Status: DC

## 2012-04-11 NOTE — Assessment & Plan Note (Signed)
Symptomatically stable.  Check spirometry.

## 2012-04-11 NOTE — Patient Instructions (Addendum)
Work with nurse on dental referral and bone density test Make two appointments as you leave:  one for your blood work tomorrow morning and one to see Dr. Raymondo Band for spirometry. I will call with lab results. You will also need a mammogram in August.

## 2012-04-12 ENCOUNTER — Other Ambulatory Visit (INDEPENDENT_AMBULATORY_CARE_PROVIDER_SITE_OTHER): Payer: Self-pay

## 2012-04-12 DIAGNOSIS — M2550 Pain in unspecified joint: Secondary | ICD-10-CM

## 2012-04-12 DIAGNOSIS — R739 Hyperglycemia, unspecified: Secondary | ICD-10-CM

## 2012-04-12 DIAGNOSIS — R7309 Other abnormal glucose: Secondary | ICD-10-CM

## 2012-04-12 DIAGNOSIS — E7800 Pure hypercholesterolemia, unspecified: Secondary | ICD-10-CM

## 2012-04-12 DIAGNOSIS — I10 Essential (primary) hypertension: Secondary | ICD-10-CM

## 2012-04-12 LAB — POCT GLYCOSYLATED HEMOGLOBIN (HGB A1C): Hemoglobin A1C: 5.6

## 2012-04-12 LAB — COMPLETE METABOLIC PANEL WITH GFR
AST: 19 U/L (ref 0–37)
Albumin: 4.1 g/dL (ref 3.5–5.2)
Alkaline Phosphatase: 60 U/L (ref 39–117)
BUN: 18 mg/dL (ref 6–23)
Calcium: 9.2 mg/dL (ref 8.4–10.5)
Chloride: 106 mEq/L (ref 96–112)
Glucose, Bld: 105 mg/dL — ABNORMAL HIGH (ref 70–99)
Potassium: 4.3 mEq/L (ref 3.5–5.3)
Sodium: 142 mEq/L (ref 135–145)
Total Protein: 6.7 g/dL (ref 6.0–8.3)

## 2012-04-12 LAB — TSH: TSH: 1.341 u[IU]/mL (ref 0.350–4.500)

## 2012-04-12 LAB — LIPID PANEL
Total CHOL/HDL Ratio: 4.7 Ratio
VLDL: 23 mg/dL (ref 0–40)

## 2012-04-12 LAB — CBC
Platelets: 252 10*3/uL (ref 150–400)
RBC: 5.22 MIL/uL — ABNORMAL HIGH (ref 3.87–5.11)
WBC: 7.2 10*3/uL (ref 4.0–10.5)

## 2012-04-12 NOTE — Assessment & Plan Note (Signed)
Advised to quit.  Discuss when sees Koval for spirometry

## 2012-04-12 NOTE — Progress Notes (Signed)
LABS DONE TODAY Andrea Robles 

## 2012-04-12 NOTE — Assessment & Plan Note (Signed)
First screening

## 2012-04-12 NOTE — Assessment & Plan Note (Signed)
Check for DM 

## 2012-04-12 NOTE — Progress Notes (Signed)
  Subjective:    Patient ID: Andrea Robles, female    DOB: 1950-11-25, 61 y.o.   MRN: [id number]  HPI FU multiple problems 1. Hypertension, well controled, needs refills. 2. Tobacco dependence, not motivated to quit now. 3. No insurance, but does have orange card 4. Much more joint and "bone" pain over last 3 months.  Multiple joints involved. 5. COPD is largely asymptomatic, never had PFTs 6. Health maint, due for choles. She cannot afford zostavax    Review of Systems     Objective:   Physical Exam HEENT, nl Neck supple Lungs, clear, no wheeze Cardiac, RRR without m or g Abd benign Ext, no joint swelling.       Assessment & Plan:

## 2012-04-12 NOTE — Assessment & Plan Note (Signed)
Good control

## 2012-04-12 NOTE — Assessment & Plan Note (Signed)
Dental referral.   No need for antibiotics at this time.

## 2012-04-12 NOTE — Assessment & Plan Note (Signed)
Will check for inflamatory arthropathy

## 2012-04-13 ENCOUNTER — Encounter: Payer: Self-pay | Admitting: Family Medicine

## 2012-04-13 LAB — RHEUMATOID FACTOR: Rhuematoid fact SerPl-aCnc: <10 IU/mL (ref <=14)

## 2012-04-13 LAB — SEDIMENTATION RATE: Sed Rate: 7 mm/hr (ref 0–22)

## 2012-04-16 DIAGNOSIS — E7800 Pure hypercholesterolemia, unspecified: Secondary | ICD-10-CM | POA: Insufficient documentation

## 2012-04-16 MED ORDER — ATORVASTATIN CALCIUM 20 MG PO TABS: 20.0000 mg | ORAL_TABLET | Freq: Every day | ORAL | Status: DC

## 2012-04-16 NOTE — Addendum Note (Signed)
Addended by: Tivis Ringer on: 04/16/2012 01:53 PM   Modules accepted: Orders

## 2012-04-16 NOTE — Assessment & Plan Note (Signed)
Called and notified about high LDL.  Start statin and recheck in 3 months.

## 2012-04-17 ENCOUNTER — Ambulatory Visit (HOSPITAL_COMMUNITY)
Admission: RE | Admit: 2012-04-17 | Discharge: 2012-04-17 | Disposition: A | Discharge status: Home / Self Care | Payer: Self-pay | Source: Ambulatory Visit | Attending: Family Medicine | Admitting: Family Medicine

## 2012-04-17 DIAGNOSIS — Z13820 Encounter for screening for osteoporosis: Secondary | ICD-10-CM | POA: Insufficient documentation

## 2012-04-17 DIAGNOSIS — Z780 Asymptomatic menopausal state: Secondary | ICD-10-CM | POA: Insufficient documentation

## 2012-04-18 ENCOUNTER — Other Ambulatory Visit: Payer: Self-pay | Admitting: Family Medicine

## 2012-04-18 DIAGNOSIS — E7800 Pure hypercholesterolemia, unspecified: Secondary | ICD-10-CM

## 2012-04-18 NOTE — Assessment & Plan Note (Signed)
Entered future order. 

## 2012-05-03 ENCOUNTER — Telehealth: Payer: Self-pay | Admitting: Family Medicine

## 2012-05-03 NOTE — Telephone Encounter (Signed)
Patient is calling to speak to someone about if any of her medication will interact with Fiber Therapy.  Also she wants to check on the status of the Dental Referral.

## 2012-05-03 NOTE — Telephone Encounter (Signed)
Spoke with dr. Leveda Anna and then called patient to inform her that this is ok to try if she wanted.

## 2012-05-07 ENCOUNTER — Telehealth: Payer: Self-pay | Admitting: Family Medicine

## 2012-05-07 DIAGNOSIS — E7800 Pure hypercholesterolemia, unspecified: Secondary | ICD-10-CM

## 2012-05-07 NOTE — Telephone Encounter (Signed)
Will forward to Dr Hensel 

## 2012-05-07 NOTE — Telephone Encounter (Signed)
Over the weekend patient experienced what she thought was stung or bitten by something on the top of her foot, but when she looked at it, she had a bruise right around a blood vessel and it hurts a lot.  Then the same thing happened on one of her fingers.  She wants to know if this could be happening since she just started taking Lipitor.  She would like to speak to Dr. Leveda Anna.

## 2012-05-07 NOTE — Telephone Encounter (Signed)
Called.  I doubt symptoms are from lipitor, but she is concerned.  Will stop for 2-3 weeks and then restart.  If symptoms recur, will switch to another statin.

## 2012-05-24 MED ORDER — PRAVASTATIN SODIUM 20 MG PO TABS: 20.0000 mg | ORAL_TABLET | Freq: Every day | ORAL | Status: DC

## 2012-05-24 NOTE — Telephone Encounter (Signed)
Dr. Leveda Anna, I spoke with patient and she says that she did stop like she was advised to and started back up, same symptoms have started again.

## 2012-05-24 NOTE — Assessment & Plan Note (Signed)
Abd cramping recurred when restarted atorvastatin.  Will switch to low potency statin.

## 2012-05-24 NOTE — Telephone Encounter (Addendum)
Still having same problem with cramping and stomach pain - generic Lipitor is what she thinks is causing it - wants to speak with Dr Leveda Anna

## 2012-05-24 NOTE — Addendum Note (Signed)
Addended by: Tivis Ringer on: 05/24/2012 11:03 AM   Modules accepted: Orders

## 2012-06-12 ENCOUNTER — Telehealth: Payer: Self-pay | Admitting: Family Medicine

## 2012-06-12 DIAGNOSIS — E7800 Pure hypercholesterolemia, unspecified: Secondary | ICD-10-CM

## 2012-06-12 MED ORDER — PRAVASTATIN SODIUM 40 MG PO TABS: 40.0000 mg | ORAL_TABLET | Freq: Every day | ORAL | Status: DC

## 2012-06-12 NOTE — Telephone Encounter (Signed)
Will forward to pcp.Andrea Robles  

## 2012-06-12 NOTE — Assessment & Plan Note (Signed)
Since tolerating, will increase to pravastatin 40 mg.  Patient informed

## 2012-06-12 NOTE — Telephone Encounter (Signed)
Patient is calling because she can get the Pravastatin, 90 day supply, at [facility] on [address] for less expensive.

## 2012-06-24 ENCOUNTER — Encounter (HOSPITAL_COMMUNITY): Payer: Self-pay | Admitting: Nurse Practitioner

## 2012-06-24 ENCOUNTER — Emergency Department (HOSPITAL_COMMUNITY)
Admission: EM | Admit: 2012-06-24 | Discharge: 2012-06-24 | Disposition: A | Discharge status: Home / Self Care | Payer: Self-pay | Attending: Emergency Medicine | Admitting: Emergency Medicine

## 2012-06-24 ENCOUNTER — Emergency Department (HOSPITAL_COMMUNITY): Payer: Self-pay

## 2012-06-24 DIAGNOSIS — R55 Syncope and collapse: Secondary | ICD-10-CM | POA: Insufficient documentation

## 2012-06-24 DIAGNOSIS — Z7982 Long term (current) use of aspirin: Secondary | ICD-10-CM | POA: Insufficient documentation

## 2012-06-24 DIAGNOSIS — F17200 Nicotine dependence, unspecified, uncomplicated: Secondary | ICD-10-CM | POA: Insufficient documentation

## 2012-06-24 DIAGNOSIS — I10 Essential (primary) hypertension: Secondary | ICD-10-CM | POA: Insufficient documentation

## 2012-06-24 DIAGNOSIS — R42 Dizziness and giddiness: Secondary | ICD-10-CM | POA: Insufficient documentation

## 2012-06-24 DIAGNOSIS — E7800 Pure hypercholesterolemia, unspecified: Secondary | ICD-10-CM | POA: Insufficient documentation

## 2012-06-24 DIAGNOSIS — R079 Chest pain, unspecified: Secondary | ICD-10-CM | POA: Insufficient documentation

## 2012-06-24 HISTORY — DX: Pure hypercholesterolemia, unspecified: E78.00

## 2012-06-24 HISTORY — DX: Essential (primary) hypertension: I10

## 2012-06-24 LAB — BASIC METABOLIC PANEL
CO2: 28 mEq/L (ref 19–32)
Calcium: 9.9 mg/dL (ref 8.4–10.5)
Creatinine, Ser: 0.86 mg/dL (ref 0.50–1.10)
Glucose, Bld: 122 mg/dL — ABNORMAL HIGH (ref 70–99)

## 2012-06-24 LAB — CBC
Hemoglobin: 15.8 g/dL — ABNORMAL HIGH (ref 12.0–15.0)
MCH: 31.3 pg (ref 26.0–34.0)
MCV: 91.3 fL (ref 78.0–100.0)
RBC: 5.04 MIL/uL (ref 3.87–5.11)

## 2012-06-24 NOTE — ED Notes (Signed)
Pt reports she is still having chest pain, will order another troponin on pt. Will try and move pt to back. Vitals updated.

## 2012-06-24 NOTE — ED Notes (Signed)
Two unsuccessful blood draw attempts  

## 2012-06-24 NOTE — ED Notes (Signed)
Pt updated on poc and wait time. No needs at this time.

## 2012-06-24 NOTE — ED Notes (Addendum)
Pt c/o chest pain and feeling lightheaded onset while cooking a meal today. States " i feel woozy like i might pass out." Pain mid sternal non radiating. A&Ox4, resp e/u. Pt took 325 mg asa today

## 2012-06-24 NOTE — ED Provider Notes (Signed)
History     CSN: [id number]  Arrival date & time 06/24/12  1146   First MD Initiated Contact with Patient 06/24/12 1852      Chief Complaint  Patient presents with  . Chest Pain    (Consider location/radiation/quality/duration/timing/severity/associated sxs/prior treatment) Patient is a 61 y.o. female presenting with chest pain. The history is provided by the patient. No language interpreter was used.  Chest Pain The chest pain began 6 - 12 hours ago. Episode Length: 30-45. Chest pain occurs intermittently. The chest pain is resolved. At its most intense, the pain is at 3/10. The pain is currently at 0/10. The severity of the pain is mild. The quality of the pain is described as dull and tightness. The pain does not radiate. Primary symptoms include fatigue. Pertinent negatives for primary symptoms include no fever, no syncope, no shortness of breath, no cough, no wheezing, no palpitations, no abdominal pain, no nausea, no vomiting, no dizziness and no altered mental status. Primary symptoms comment: felt lightheaded  Associated symptoms include near-syncope.  Pertinent negatives for associated symptoms include no claudication, no diaphoresis, no lower extremity edema, no numbness, no orthopnea, no paroxysmal nocturnal dyspnea and no weakness. She tried nothing for the symptoms. Risk factors include smoking/tobacco exposure.  Her past medical history is significant for hyperlipidemia and hypertension.  Pertinent negatives for past medical history include no seizures.  Her family medical history is significant for heart disease in family.  Procedure history is positive for persantine thallium. Procedure history comments: 5+ yrs ago.     Past Medical History  Diagnosis Date  . Hypertension   . Hypercholesteremia     Past Surgical History  Procedure Date  . Abdominal hysterectomy     History reviewed. No pertinent family history.  History  Substance Use Topics  . Smoking status:  Current Everyday Smoker -- 0.5 packs/day for 44 years    Types: Cigarettes  . Smokeless tobacco: Never Used  . Alcohol Use: No    OB History    Grav Para Term Preterm Abortions TAB SAB Ect Mult Living                  Review of Systems  Constitutional: Positive for fatigue. Negative for fever, chills, diaphoresis, activity change and appetite change.  HENT: Negative for congestion, sore throat, facial swelling, rhinorrhea and trouble swallowing.   Eyes: Negative.   Respiratory: Positive for chest tightness. Negative for cough, choking, shortness of breath and wheezing.   Cardiovascular: Positive for chest pain and near-syncope. Negative for palpitations, orthopnea, claudication, leg swelling and syncope.  Gastrointestinal: Negative for nausea, vomiting, abdominal pain, diarrhea and abdominal distention.  Genitourinary: Negative.   Musculoskeletal: Negative.   Skin: Negative.   Neurological: Positive for light-headedness. Negative for dizziness, seizures, syncope, speech difficulty, weakness, numbness and headaches.  Psychiatric/Behavioral: Negative.  Negative for altered mental status.    Allergies  Famotidine; Metronidazole; and Lipitor  Home Medications   Current Outpatient Rx  Name Route Sig Dispense Refill  . ASPIRIN 325 MG PO TBEC Oral Take 325 mg by mouth daily.    . ENALAPRIL-HYDROCHLOROTHIAZIDE 5-12.5 MG PO TABS Oral Take 1 tablet by mouth daily. 90 tablet 3  . METOPROLOL TARTRATE 50 MG PO TABS Oral Take 50 mg by mouth 2 (two) times daily.    Marland Kitchen FISH OIL 1000 MG PO CAPS Oral Take 1,000 mg by mouth daily.    Marland Kitchen PRAVASTATIN SODIUM 40 MG PO TABS Oral Take 40 mg by mouth  daily.      BP 149/74  Pulse 60  Temp 97.7 F (36.5 C) (Oral)  Resp 18  SpO2 97%  Physical Exam  Nursing note and vitals reviewed. Constitutional: She is oriented to person, place, and time. She appears well-developed and well-nourished. No distress.  HENT:  Head: Normocephalic and atraumatic.    Right Ear: External ear normal.  Left Ear: External ear normal.  Nose: Nose normal.  Mouth/Throat: Oropharynx is clear and moist. No oropharyngeal exudate.  Eyes: Conjunctivae and EOM are normal. Pupils are equal, round, and reactive to light. Right eye exhibits no discharge. Left eye exhibits no discharge. No scleral icterus.  Neck: Normal range of motion. Neck supple. No JVD present. No tracheal deviation present. No thyromegaly present.  Cardiovascular: Regular rhythm, S1 normal, S2 normal, normal heart sounds, intact distal pulses and normal pulses.   No extrasystoles are present. Bradycardia present.  PMI is not displaced.  Exam reveals no gallop and no decreased pulses.   No murmur heard. Pulmonary/Chest: Effort normal and breath sounds normal. No respiratory distress. She has no wheezes. She has no rales. She exhibits no tenderness.  Abdominal: Soft. Bowel sounds are normal. She exhibits no distension and no mass. There is no tenderness. There is no rebound and no guarding.  Musculoskeletal: Normal range of motion. She exhibits no edema and no tenderness.  Lymphadenopathy:    She has no cervical adenopathy.  Neurological: She is alert and oriented to person, place, and time. No cranial nerve deficit.       Nl gait  Skin: Skin is warm and dry. No rash noted. She is not diaphoretic. No erythema. No pallor.  Psychiatric: She has a normal mood and affect. Her behavior is normal.    ED Course  Procedures (including critical care time)  Labs Reviewed  CBC - Abnormal; Notable for the following:    Hemoglobin 15.8 (*)     All other components within normal limits  BASIC METABOLIC PANEL - Abnormal; Notable for the following:    Glucose, Bld 122 (*)     GFR calc non Af Amer 72 (*)     GFR calc Af Amer 83 (*)     All other components within normal limits  POCT I-STAT TROPONIN I  POCT I-STAT TROPONIN I   Dg Chest 2 View  06/24/2012  *RADIOLOGY REPORT*  Clinical Data: Chest pain,  dizziness  CHEST - 2 VIEW  Comparison: 12/12/2003  Findings: Stable left infrahilar linear scarring or subsegmental atelectasis.  Lungs otherwise clear.  No effusion.  Heart size normal.  Regional bones unremarkable.  IMPRESSION:  No acute disease   Original Report Authenticated By: Osa Craver, M.D.      No diagnosis found.   Date: 06/24/2012  Rate: 56 bpm  Rhythm: sinus bradycardia  QRS Axis: normal  Intervals: normal  ST/T Wave abnormalities: nonspecific ST changes  Conduction Disutrbances:none  Narrative Interpretation: note septal q waves  Old EKG Reviewed: none available      MDM  Pt presents for evaluation of chest discomfort.  She reports a vague tightness that began while she was making waffles with her granddaughter.  She is currently pain free.  She does have a history of being a lifelong smoker with hypertension and dyslipidemia.  She has not had a recent stress test.  Basic labs, CXR, and troponin x2 are negative.  Pt does have a local PMD.  Discussed options for further evaluation and follow-up.  Plan D/C  home.  Will refer to a local cardiologist for an establish care appt and further risk assessment and testing.  Pt advised that if pain should return, she should return to the ER for likely admission and inpt stress test.        Tobin Chad, MD 06/24/12 2006

## 2012-06-29 ENCOUNTER — Encounter: Payer: Self-pay | Admitting: Family Medicine

## 2012-06-29 ENCOUNTER — Ambulatory Visit (INDEPENDENT_AMBULATORY_CARE_PROVIDER_SITE_OTHER): Payer: Self-pay | Admitting: Family Medicine

## 2012-06-29 VITALS — BP 140/65 | HR 56 | Temp 98.1°F | Ht 69.0 in | Wt 180.0 lb

## 2012-06-29 DIAGNOSIS — I10 Essential (primary) hypertension: Secondary | ICD-10-CM

## 2012-06-29 DIAGNOSIS — E7800 Pure hypercholesterolemia, unspecified: Secondary | ICD-10-CM

## 2012-06-29 DIAGNOSIS — F17200 Nicotine dependence, unspecified, uncomplicated: Secondary | ICD-10-CM

## 2012-06-29 MED ORDER — PRAVASTATIN SODIUM 40 MG PO TABS: 20.0000 mg | ORAL_TABLET | Freq: Every day | ORAL | Status: DC

## 2012-06-29 NOTE — Patient Instructions (Addendum)
OK to cut your pravachol in half and take 20 mg daily.  I will want to check your chol (you don't need to be fasting) after you have been on that dose for three months. Quit your aspirin five days before the teeth are removed.  Stay on all other meds. See Dr. Raymondo Band next week for smoking cessation and 24 hour BP monitor.

## 2012-06-29 NOTE — Assessment & Plan Note (Signed)
Runs high at times and yet has symptoms to suggest overtreatment (lightheadedness)  Will obtain 24 hour ambulatory monitor to get more accurate information.

## 2012-06-29 NOTE — Progress Notes (Signed)
  Subjective:    Patient ID: Andrea Robles, female    DOB: [birth year], 61 y.o.   MRN: [id number]  HPI does not feel great.  Has spells of lightheadedness (not vertigo) on standing and sitting.  Did have one episode of chest discomfort, was seen in ER and released.  Has not been feeling well since I upped her pravachol to 40 mg daily.  Also, while in ER they recorded several low BPs e.g. 100 systolic. Denies any more CP or SOB.  No syncope.    Review of Systems     Objective:   Physical ExamLungs clear Cardiac RRR without m or g No edema        Assessment & Plan:

## 2012-06-29 NOTE — Assessment & Plan Note (Signed)
Decrease pravastatin to 20 mg daily.  Recheck in 3 months.

## 2012-06-29 NOTE — Assessment & Plan Note (Signed)
Needs to quit smoking.  Will ask pharmacologist to address.

## 2012-07-02 ENCOUNTER — Ambulatory Visit (INDEPENDENT_AMBULATORY_CARE_PROVIDER_SITE_OTHER): Payer: Self-pay | Admitting: Pharmacist

## 2012-07-02 ENCOUNTER — Encounter: Payer: Self-pay | Admitting: Pharmacist

## 2012-07-02 VITALS — BP 141/86 | HR 52 | Ht 69.0 in | Wt 181.8 lb

## 2012-07-02 DIAGNOSIS — E7800 Pure hypercholesterolemia, unspecified: Secondary | ICD-10-CM

## 2012-07-02 DIAGNOSIS — I10 Essential (primary) hypertension: Secondary | ICD-10-CM

## 2012-07-02 DIAGNOSIS — F17200 Nicotine dependence, unspecified, uncomplicated: Secondary | ICD-10-CM

## 2012-07-02 NOTE — Progress Notes (Signed)
  Subjective:    Patient ID: Andrea Robles, female    DOB: July 03, 1951, 61 y.o.   MRN: [id number]  HPI  Patient arrives in good spirits accompanied by her husband who reports prior stroke. Patient and her husband report currently smoking <2ppd of [employer] between the two of them (used to smoke 2 cartons/wk together).  They are both interested in quitting and have recently ordered electronic cigarettes in an attempt to cut down on their nicotine intake. Patient expresses understanding of importance of smoking cessation.  [name] presented to the clinic for 24-hour ambulatory blood pressure monitoring after referral by Dr. Leveda Anna.  Patient reports compliance with all BP meds.  Discussed procedure for wearing the monitor and gave patient written instructions. Monitor was placed on non-dominant arm with instructions to return in the morning. Patient returned to the clinic and reported no problems with the device.  She had one episode of dizziness in the late afternoon.  Current BP Medications Enalapril/HCTZ 5-12.5 mg daily Metoprolol tartrate 50 mg BID  Allergies  Allergen Reactions  . Famotidine Other (See Comments)     Rash and itching while on multiple meds for H pylori  . Lipitor (Atorvastatin) Other (See Comments)    Stomach and leg cramps  . Metronidazole Other (See Comments)     Rash and itching while on mult meds for H pylori    Brand smoked [employer]. Estimated Nicotine Content per Cigarette (mg) 0.5.    Review of Systems     Objective:   Physical Exam Last 3 Office BP readings: 140/65 mmHg  Today's Office BP reading: 141/86 mmHg (automatic reading)  ABPM Study Data: Arm Placement left arm   Overall Mean 24hr BP:   116/64 mmHg HR: 57   Daytime Mean BP:  122/67 mmHg HR: 57   Nighttime Mean BP:  100/53 mmHg HR: 59   Dipping Pattern: yes  Sys:   17.5%   Dia: 20.9%   [normal dipping ~10-20%]   Non-hypertensive ABPM thresholds: daytime BP  <135/85 mmHg, sleeptime BP <120/75 mmHg NICE Hypertension Guidelines ([place]) using ABPM: Stage I: >135/85 mmHg, Stage 2: >150/95 mmHg)   BMET    Component Value Date/Time   NA 137 06/24/2012 1235   K 3.7 06/24/2012 1235   CL 100 06/24/2012 1235   CO2 28 06/24/2012 1235   GLUCOSE 122* 06/24/2012 1235   BUN 15 06/24/2012 1235   CREATININE 0.86 06/24/2012 1235   CREATININE 1.04 04/12/2012 0829   CALCIUM 9.9 06/24/2012 1235   GFRNONAA 72* 06/24/2012 1235   GFRAA 83* 06/24/2012 1235        Assessment & Plan:  . [name] completed a 24-hour ambulatory blood pressure study with results that appear to be consistent with a component of white coat hypertension (BP at goal) based on average blood pressure of 116/64 mmHg, with a nocturnal dipping pattern that is normal. Decreased metoprolol dose from 50 BID to 25 BID.  Patient will split 50 mg tablets until next visit.  Regular follow-up with PCP Dr. Leveda Anna in 3-4 wks. Patient seen with Birder Robson, PharmD Candidate.  Mild Nicotine Dependence of many years duration in a patient who is fair candidate for success b/c of current level of motivation. Patient recently purchase e-cigarette and will start tomorrow.  Patient willing to consider tobacco cessation counseling in the future if needed.

## 2012-07-03 LAB — LDL CHOLESTEROL, DIRECT: Direct LDL: 110 mg/dL — ABNORMAL HIGH

## 2012-07-03 NOTE — Assessment & Plan Note (Signed)
Mild Nicotine Dependence of many years duration in a patient who is fair candidate for success b/c of current level of motivation. Patient recently purchase e-cigarette and will start tomorrow.  Patient willing to consider tobacco cessation counseling in the future if needed.

## 2012-07-03 NOTE — Assessment & Plan Note (Signed)
Andrea Robles completed a 24-hour ambulatory blood pressure study with results that appear to be consistent with a component of white coat hypertension (BP at goal) based on average blood pressure of 116/64 mmHg, with a nocturnal dipping pattern that is normal. Decreased metoprolol dose from 50 BID to 25 BID.  Patient will split 50 mg tablets until next visit.  Regular follow-up with PCP Dr. Leveda Anna in 3-4 wks. Patient seen with Birder Robson, PharmD Candidate.

## 2012-07-03 NOTE — Patient Instructions (Addendum)
Decrease your metoporolol to 25mg  (half tablet) twice daily.   Lab test for LDL cholesterol today.     Next follow up with Dr. Leveda Anna  - Schedule in the next 3-4 weeks

## 2012-07-03 NOTE — Progress Notes (Signed)
Patient ID: Andrea Robles, female   DOB: 19-Oct-1951, 61 y.o.   MRN: [id number] Reviewed and agree with Dr. Macky Lower management and documentation.

## 2012-08-03 ENCOUNTER — Ambulatory Visit (INDEPENDENT_AMBULATORY_CARE_PROVIDER_SITE_OTHER): Payer: Self-pay | Admitting: Family Medicine

## 2012-08-03 ENCOUNTER — Encounter: Payer: Self-pay | Admitting: Family Medicine

## 2012-08-03 VITALS — BP 132/68 | HR 66 | Temp 98.0°F | Ht 69.0 in | Wt 175.3 lb

## 2012-08-03 DIAGNOSIS — J449 Chronic obstructive pulmonary disease, unspecified: Secondary | ICD-10-CM

## 2012-08-03 DIAGNOSIS — E7800 Pure hypercholesterolemia, unspecified: Secondary | ICD-10-CM

## 2012-08-03 DIAGNOSIS — Z23 Encounter for immunization: Secondary | ICD-10-CM

## 2012-08-03 DIAGNOSIS — J4489 Other specified chronic obstructive pulmonary disease: Secondary | ICD-10-CM

## 2012-08-03 DIAGNOSIS — I10 Essential (primary) hypertension: Secondary | ICD-10-CM

## 2012-08-03 DIAGNOSIS — Z87891 Personal history of nicotine dependence: Secondary | ICD-10-CM

## 2012-08-03 NOTE — Assessment & Plan Note (Signed)
Less cough and SOB now that quit smoking

## 2012-08-03 NOTE — Patient Instructions (Signed)
You are doing great.  I am proud of the weight loss and quitting smoking. You got a flu shot today. You are due for a mammogram  Last one was July 2012 See me in 6 months.

## 2012-08-03 NOTE — Assessment & Plan Note (Signed)
Well controled. 

## 2012-08-03 NOTE — Progress Notes (Signed)
  Subjective:    Patient ID: Andrea Robles, female    DOB: January 11, 1951, 61 y.o.   MRN: [id number]  HPI Had one bout of chest pain several months ago.  Non since.  Feels well. No complaint. Taking meds as prescribed.  Recent blood work fine including LDL=110.   Quit smoking ~ 1 month ago.  Using e cigs.      Review of Systems     Objective:   Physical Exam Lungs clear Cardiac RRR without m or g Note five lb weight loss.       Assessment & Plan:

## 2012-08-03 NOTE — Assessment & Plan Note (Signed)
At goal on current dose.

## 2012-10-05 ENCOUNTER — Encounter: Payer: Self-pay | Admitting: Family Medicine

## 2012-10-05 ENCOUNTER — Ambulatory Visit (INDEPENDENT_AMBULATORY_CARE_PROVIDER_SITE_OTHER): Payer: No Typology Code available for payment source | Admitting: Family Medicine

## 2012-10-05 VITALS — BP 149/69 | HR 67 | Temp 97.8°F | Ht 69.0 in | Wt 191.3 lb

## 2012-10-05 DIAGNOSIS — M545 Low back pain, unspecified: Secondary | ICD-10-CM | POA: Insufficient documentation

## 2012-10-05 DIAGNOSIS — I10 Essential (primary) hypertension: Secondary | ICD-10-CM

## 2012-10-05 DIAGNOSIS — M5412 Radiculopathy, cervical region: Secondary | ICD-10-CM

## 2012-10-05 DIAGNOSIS — M5430 Sciatica, unspecified side: Secondary | ICD-10-CM | POA: Insufficient documentation

## 2012-10-05 DIAGNOSIS — M5440 Lumbago with sciatica, unspecified side: Secondary | ICD-10-CM

## 2012-10-05 MED ORDER — CYCLOBENZAPRINE HCL 10 MG PO TABS: 10.0000 mg | ORAL_TABLET | Freq: Three times a day (TID) | ORAL | Status: DC | PRN

## 2012-10-05 MED ORDER — HYDROCODONE-ACETAMINOPHEN 5-500 MG PO CAPS: 1.0000 | ORAL_CAPSULE | Freq: Four times a day (QID) | ORAL | Status: DC | PRN

## 2012-10-05 MED ORDER — MELOXICAM 15 MG PO TABS: 15.0000 mg | ORAL_TABLET | Freq: Every day | ORAL | Status: DC

## 2012-10-05 NOTE — Assessment & Plan Note (Addendum)
To right leg.  No objective weakness or numbness.

## 2012-10-05 NOTE — Progress Notes (Signed)
  Subjective:    Patient ID: Andrea Robles, female    DOB: Nov 08, 1950, 61 y.o.   MRN: [id number]  HPI  Injured back 1 week ago due to being knocked off steps by the neighbors dog.  Now with low back pain and Rt leg/foot tingling with numbness at night.    Also, bilateral arm tingling Rt>Lt.  Worsened by misstep.  Known C spine spondylolsis.    Review of Systems     Objective:   Physical Exam Neck, almost full ROM.  Normal strength, sensation and reflexes in both upper ext. Lumbar spine - marked tenderness and Paraspinous muscle spasm. Normal strength, sensation and reflexes in both lower ext.       Assessment & Plan:

## 2012-10-05 NOTE — Assessment & Plan Note (Signed)
Unrelated to back injury.  It is time to reimage.  Will wait until over back spasm.

## 2012-10-05 NOTE — Patient Instructions (Signed)
Keep walking and doing the stretching exercises. I prescribed three medicines for you. 1. Meloxicam is like super ibuprofen.  Do not take ibuprofen with this med.  Take every day.  It is the last one to stop. 2. Flexeril is a muscle relaxant.  You can take it three times a day.  It may make you sleepy.  You can also just take it at night. It would be the second one to stop. 3. Hydrocodone is purely a pain medicine to help you feel better.  It will not make your back pain go away any sooner.  Take it as you need and it is the first one to stop. We will not do any special X rays for at least 6-8 weeks. I do want to do neck x rays when you feel better Check your blood pressure when you are in less pain. Call me if not improving in 2 weeks and I will set up some physical therapy.

## 2012-10-05 NOTE — Assessment & Plan Note (Signed)
BP up but in pain.

## 2013-01-25 ENCOUNTER — Encounter: Payer: Self-pay | Admitting: Family Medicine

## 2013-01-25 ENCOUNTER — Ambulatory Visit (INDEPENDENT_AMBULATORY_CARE_PROVIDER_SITE_OTHER): Payer: No Typology Code available for payment source | Admitting: Family Medicine

## 2013-01-25 VITALS — BP 119/73 | HR 63 | Temp 98.0°F | Ht 69.0 in | Wt 194.0 lb

## 2013-01-25 DIAGNOSIS — M5412 Radiculopathy, cervical region: Secondary | ICD-10-CM

## 2013-01-25 DIAGNOSIS — I10 Essential (primary) hypertension: Secondary | ICD-10-CM

## 2013-01-25 DIAGNOSIS — R7309 Other abnormal glucose: Secondary | ICD-10-CM

## 2013-01-25 DIAGNOSIS — E7800 Pure hypercholesterolemia, unspecified: Secondary | ICD-10-CM

## 2013-01-25 DIAGNOSIS — R252 Cramp and spasm: Secondary | ICD-10-CM | POA: Insufficient documentation

## 2013-01-25 DIAGNOSIS — R739 Hyperglycemia, unspecified: Secondary | ICD-10-CM

## 2013-01-25 DIAGNOSIS — Z23 Encounter for immunization: Secondary | ICD-10-CM

## 2013-01-25 DIAGNOSIS — J449 Chronic obstructive pulmonary disease, unspecified: Secondary | ICD-10-CM

## 2013-01-25 LAB — COMPLETE METABOLIC PANEL WITH GFR
ALT: 14 U/L (ref 0–35)
CO2: 25 mEq/L (ref 19–32)
Calcium: 9.2 mg/dL (ref 8.4–10.5)
Chloride: 105 mEq/L (ref 96–112)
GFR, Est African American: 67 mL/min
Potassium: 3.9 mEq/L (ref 3.5–5.3)
Sodium: 139 mEq/L (ref 135–145)
Total Protein: 6.7 g/dL (ref 6.0–8.3)

## 2013-01-25 MED ORDER — LORAZEPAM 0.5 MG PO TABS: 0.5000 mg | ORAL_TABLET | Freq: Two times a day (BID) | ORAL | Status: DC | PRN

## 2013-01-25 NOTE — Assessment & Plan Note (Signed)
No wheezing off cigarettes

## 2013-01-25 NOTE — Assessment & Plan Note (Signed)
Good control

## 2013-01-25 NOTE — Patient Instructions (Addendum)
I am checking your cholesterol and potassium and for diabetes today We will decide what to do next based on the blood work.  I'll call Monday.

## 2013-01-25 NOTE — Assessment & Plan Note (Signed)
Check K+.  If OK, may need trial off statin.

## 2013-01-25 NOTE — Assessment & Plan Note (Signed)
Check for dm 

## 2013-01-25 NOTE — Assessment & Plan Note (Signed)
Check labs 

## 2013-01-25 NOTE — Progress Notes (Signed)
  Subjective:    Patient ID: Andrea Robles, female    DOB: 10-Nov-1950, 62 y.o.   MRN: [id number]  HPI  Andrea Robles is doing quite well.  Her only complaint is nighttime leg cramps.  The best thing is that she has quite smoking - now only using e cigarettes.   Not bad on health maint. Needs tetanus booster. Cannot afford out of pocket cost for zostavax.      Review of Systems Denies CP, bleeding, wt change, bowel/bladder change, skin lesions, DOE.     Objective:   Physical Exam HEENT normal. Neck supple without masses Lungs clear Cardiac RRR without m or g  abd benign Ext no edema       Assessment & Plan:

## 2013-01-29 ENCOUNTER — Encounter: Payer: Self-pay | Admitting: Family Medicine

## 2013-03-05 ENCOUNTER — Ambulatory Visit (INDEPENDENT_AMBULATORY_CARE_PROVIDER_SITE_OTHER): Payer: No Typology Code available for payment source | Admitting: Emergency Medicine

## 2013-03-05 ENCOUNTER — Ambulatory Visit (HOSPITAL_COMMUNITY)
Admission: RE | Admit: 2013-03-05 | Discharge: 2013-03-05 | Disposition: A | Discharge status: Home / Self Care | Payer: No Typology Code available for payment source | Source: Ambulatory Visit | Attending: Family Medicine | Admitting: Family Medicine

## 2013-03-05 ENCOUNTER — Telehealth: Payer: Self-pay | Admitting: Emergency Medicine

## 2013-03-05 VITALS — BP 109/71 | HR 60 | Temp 98.1°F | Ht 69.0 in | Wt 191.9 lb

## 2013-03-05 DIAGNOSIS — R059 Cough, unspecified: Secondary | ICD-10-CM | POA: Insufficient documentation

## 2013-03-05 DIAGNOSIS — R05 Cough: Secondary | ICD-10-CM

## 2013-03-05 DIAGNOSIS — I10 Essential (primary) hypertension: Secondary | ICD-10-CM | POA: Insufficient documentation

## 2013-03-05 DIAGNOSIS — E7800 Pure hypercholesterolemia, unspecified: Secondary | ICD-10-CM | POA: Insufficient documentation

## 2013-03-05 DIAGNOSIS — Z87891 Personal history of nicotine dependence: Secondary | ICD-10-CM | POA: Insufficient documentation

## 2013-03-05 DIAGNOSIS — Q254 Congenital malformation of aorta unspecified: Secondary | ICD-10-CM | POA: Insufficient documentation

## 2013-03-05 DIAGNOSIS — R0602 Shortness of breath: Secondary | ICD-10-CM | POA: Insufficient documentation

## 2013-03-05 MED ORDER — ALBUTEROL SULFATE HFA 108 (90 BASE) MCG/ACT IN AERS: 2.0000 | INHALATION_SPRAY | RESPIRATORY_TRACT | Status: DC | PRN

## 2013-03-05 NOTE — Assessment & Plan Note (Signed)
She appears well and is talking in full sentences, O2 sat 98% on room air.  Differential includes post-viral syndrome, allergies, pneumonia, heart failure exacerbation.  She is on a ACE-I, but I doubt this is the cause of her cough.  Given lung findings on exam, I will check a chest x-ray.  Continue zyrtec and bendaryl.  Will send albuterol inhaler for wheezing.  Will decide on antibiotics based on chest x-ray.  Discussed this with the patient, including that if the x-ray is negative for pneumonia there is no reason to start antibiotics.  Discussed post-viral syndrome with her as well.  I will call her with the results of the chest x-ray and she will follow up next week or sooner if worsening.

## 2013-03-05 NOTE — Patient Instructions (Addendum)
It was nice to meet you! Please to go Radiology in the hospital for a chest x-ray. Take albuterol 2 puffs every 4 hours as needed for wheezing or shortness of breath. Take Zyrtec 1 tablet daily. Take benadryl 1-2 tablets every 6 hours as needed. I will call you this afternoon with the results of the chest x-ray. Follow up next week or sooner if not improving.

## 2013-03-05 NOTE — Telephone Encounter (Signed)
Called and reviewed x-ray with patient.  No pneumonia, but she does have some chronic bronchitic changes.  This was likely a viral illness, now with post-viral syndrome.  She picked up the albuterol inhaler, which will likely help quite a bit.  She will follow up next week with me or Dr. Leveda Anna, sooner if things get worse.

## 2013-03-05 NOTE — Progress Notes (Signed)
  Subjective:    Patient ID: Andrea Robles, female    DOB: May 20, 1951, 62 y.o.   MRN: [id number]  HPI Andrea Robles is here for a SDA for cough and shortness of breath.  She reports feeling short of breath and coughing for the last 3 weeks.  Cough is mostly dry, but sometimes has some greenish-yellow sputum.  Has had some wheezing and subjective fevers.  No nasal symptoms or sore throat.  Seems to be worse with laying flat and exertion and has has some mild bilaterally ankle swelling the last week or so.  She has been taking Zyrtec and benadryl without much improvement.  I have reviewed and updated the following as appropriate: allergies, current medications and past medical history PMHx: HTN, no history of heart failure  SHx: former smoker, quit 9 months ago   Review of Systems See HPI    Objective:   Physical Exam BP 109/71  Pulse 60  Temp(Src) 98.1 F (36.7 C) (Oral)  Ht 5\' 9"  (1.753 m)  Wt 191 lb 14.4 oz (87.045 kg)  BMI 28.33 kg/m2  SpO2 98% Gen: alert, cooperative, NAD, non toxic, breathing comfortably HEENT: AT/[place], sclera white, MMM Neck: supple CV: RRR, no murmurs or gallops Pulm: good air movement, rhonchi in left upper lobe otherwise clear Ext: no edema     Assessment & Plan:

## 2013-05-05 ENCOUNTER — Other Ambulatory Visit: Payer: Self-pay | Admitting: Family Medicine

## 2013-07-08 ENCOUNTER — Ambulatory Visit (INDEPENDENT_AMBULATORY_CARE_PROVIDER_SITE_OTHER): Payer: No Typology Code available for payment source | Admitting: *Deleted

## 2013-07-08 VITALS — Temp 98.0°F

## 2013-07-08 DIAGNOSIS — Z23 Encounter for immunization: Secondary | ICD-10-CM

## 2013-11-17 ENCOUNTER — Other Ambulatory Visit: Payer: Self-pay | Admitting: Family Medicine

## 2013-12-11 ENCOUNTER — Ambulatory Visit: Payer: No Typology Code available for payment source | Admitting: Family Medicine

## 2013-12-13 ENCOUNTER — Encounter: Payer: Self-pay | Admitting: Family Medicine

## 2013-12-13 ENCOUNTER — Ambulatory Visit
Admission: RE | Admit: 2013-12-13 | Discharge: 2013-12-13 | Disposition: A | Discharge status: Home / Self Care | Payer: No Typology Code available for payment source | Source: Ambulatory Visit | Attending: Family Medicine | Admitting: Family Medicine

## 2013-12-13 ENCOUNTER — Ambulatory Visit (INDEPENDENT_AMBULATORY_CARE_PROVIDER_SITE_OTHER): Payer: No Typology Code available for payment source | Admitting: Family Medicine

## 2013-12-13 VITALS — BP 133/83 | HR 62 | Ht 69.0 in | Wt 202.0 lb

## 2013-12-13 DIAGNOSIS — M5412 Radiculopathy, cervical region: Secondary | ICD-10-CM

## 2013-12-13 DIAGNOSIS — Z1239 Encounter for other screening for malignant neoplasm of breast: Secondary | ICD-10-CM

## 2013-12-13 DIAGNOSIS — I10 Essential (primary) hypertension: Secondary | ICD-10-CM

## 2013-12-13 DIAGNOSIS — J449 Chronic obstructive pulmonary disease, unspecified: Secondary | ICD-10-CM

## 2013-12-13 DIAGNOSIS — E7800 Pure hypercholesterolemia, unspecified: Secondary | ICD-10-CM

## 2013-12-13 DIAGNOSIS — R7309 Other abnormal glucose: Secondary | ICD-10-CM

## 2013-12-13 DIAGNOSIS — R739 Hyperglycemia, unspecified: Secondary | ICD-10-CM

## 2013-12-13 LAB — COMPLETE METABOLIC PANEL WITH GFR
ALT: 16 U/L (ref 0–35)
AST: 18 U/L (ref 0–37)
Albumin: 4.4 g/dL (ref 3.5–5.2)
Alkaline Phosphatase: 64 U/L (ref 39–117)
BILIRUBIN TOTAL: 0.6 mg/dL (ref 0.2–1.2)
BUN: 18 mg/dL (ref 6–23)
CALCIUM: 8.9 mg/dL (ref 8.4–10.5)
CHLORIDE: 103 meq/L (ref 96–112)
CO2: 27 meq/L (ref 19–32)
CREATININE: 0.93 mg/dL (ref 0.50–1.10)
GFR, EST AFRICAN AMERICAN: 77 mL/min
GFR, EST NON AFRICAN AMERICAN: 67 mL/min
GLUCOSE: 109 mg/dL — AB (ref 70–99)
Potassium: 3.7 mEq/L (ref 3.5–5.3)
SODIUM: 138 meq/L (ref 135–145)
TOTAL PROTEIN: 7.3 g/dL (ref 6.0–8.3)

## 2013-12-13 LAB — LIPID PANEL
Cholesterol: 162 mg/dL (ref 0–200)
HDL: 48 mg/dL (ref >39)
LDL CALC: 86 mg/dL (ref 0–99)
TRIGLYCERIDES: 138 mg/dL (ref <150)
Total CHOL/HDL Ratio: 3.4 Ratio
VLDL: 28 mg/dL (ref 0–40)

## 2013-12-13 LAB — POCT GLYCOSYLATED HEMOGLOBIN (HGB A1C): Hemoglobin A1C: 5.6

## 2013-12-13 MED ORDER — NORTRIPTYLINE HCL 25 MG PO CAPS: 25.0000 mg | ORAL_CAPSULE | Freq: Every day | ORAL | Status: DC

## 2013-12-13 NOTE — Assessment & Plan Note (Signed)
Mammo

## 2013-12-13 NOTE — Assessment & Plan Note (Addendum)
Worsening symptoms.  Will add nortriptyline which should be affordable.  Recheck X rays to make sure slippage not worsening.  Called with labs.  Nortriptyline too sedating.  Will use prn tramadol.

## 2013-12-13 NOTE — Assessment & Plan Note (Signed)
Stable now off cigarettes.  Pneumovax/Prevnar would be ideal but cost an issue.  Last pneumovax 2009 so OK for now.

## 2013-12-13 NOTE — Assessment & Plan Note (Signed)
Good control.  Watch weight gain.

## 2013-12-13 NOTE — Patient Instructions (Signed)
Give my new neck medicine a try for a month.  You do need to take it regularly. Start by taking just one pill at night.  Depending on how it affects you, build to taking one pill in the morning and two pills at night. Call me in a month to let me know how you are doing on this medicine. I will call with the blood and X ray results. Get a mammogram

## 2013-12-13 NOTE — Progress Notes (Signed)
   Subjective:    Patient ID: TANETTE CHAUCA, female    DOB: [birth year], 63 y.o.   MRN: [id number]  HPICheck several issues. Worsening neck discomfort.  Bilateral arm radiation Lt >Rt.  Has numb left small finger.  Known spondylolysis of C spine.  No Xrays in >5 years. Quit smoking remains Weight creeping up. Takes 1/2 pravastatin a day because higher dose seems to give muscle cramps in legs. Needs mamm[facility] and cannot afford zostavax.  Finances a real issue with meds.  Never filled lorazepam due to cost=$47 Concern for darkening and thickening of nose    Review of Systems     Objective:   Physical ExamMild rhinophyma Neck, mild limitation of rotational ROM Arms normal strength and reflexes. Lungs clear Cardiac RRR without m or g         Assessment & Plan:

## 2013-12-16 MED ORDER — TRAMADOL HCL 50 MG PO TABS
50.0000 mg | ORAL_TABLET | Freq: Three times a day (TID) | ORAL | Status: DC | PRN
Start: 2013-12-16 — End: 2014-10-31

## 2013-12-16 NOTE — Addendum Note (Signed)
Addended by: Zenia Resides on: 12/16/2013 09:12 AM   Modules accepted: Orders, Medications

## 2013-12-24 ENCOUNTER — Ambulatory Visit
Admission: RE | Admit: 2013-12-24 | Discharge: 2013-12-24 | Disposition: A | Discharge status: Home / Self Care | Payer: No Typology Code available for payment source | Source: Ambulatory Visit | Attending: Family Medicine | Admitting: Family Medicine

## 2013-12-24 DIAGNOSIS — Z1239 Encounter for other screening for malignant neoplasm of breast: Secondary | ICD-10-CM

## 2014-01-20 ENCOUNTER — Telehealth: Payer: Self-pay | Admitting: Family Medicine

## 2014-01-20 MED ORDER — FLUCONAZOLE 150 MG PO TABS: 150.0000 mg | ORAL_TABLET | Freq: Once | ORAL | Status: DC

## 2014-01-20 NOTE — Telephone Encounter (Signed)
Pt thinks she has a yeast infection. Would like flagy called in to [facility] on [facility]

## 2014-01-20 NOTE — Telephone Encounter (Signed)
Called and discussed.  No recent antibiotics.  Has classic symptome of burning and itching.  Will try diflucan x 1.  She will see me if symptoms persist.

## 2014-01-20 NOTE — Telephone Encounter (Signed)
Please advise.Thank you.Gift Rueckert S  

## 2014-03-25 ENCOUNTER — Ambulatory Visit: Payer: No Typology Code available for payment source

## 2014-04-27 ENCOUNTER — Other Ambulatory Visit: Payer: Self-pay | Admitting: Family Medicine

## 2014-05-05 ENCOUNTER — Encounter: Payer: Self-pay | Admitting: Family Medicine

## 2014-05-05 ENCOUNTER — Ambulatory Visit (INDEPENDENT_AMBULATORY_CARE_PROVIDER_SITE_OTHER): Payer: No Typology Code available for payment source | Admitting: Family Medicine

## 2014-05-05 VITALS — BP 150/80 | HR 64 | Temp 98.1°F | Ht 69.0 in | Wt 208.0 lb

## 2014-05-05 DIAGNOSIS — R300 Dysuria: Secondary | ICD-10-CM

## 2014-05-05 LAB — POCT URINALYSIS DIPSTICK
Bilirubin, UA: NEGATIVE
Glucose, UA: NEGATIVE
Ketones, UA: NEGATIVE
LEUKOCYTES UA: NEGATIVE
NITRITE UA: NEGATIVE
PH UA: 5.5
PROTEIN UA: NEGATIVE
RBC UA: NEGATIVE
Spec Grav, UA: 1.01
UROBILINOGEN UA: 0.2

## 2014-05-05 NOTE — Patient Instructions (Addendum)
Andrea Robles, it was a pleasure seeing you today. Today we talked about your pressure with urination. The urine study we did does not suggest that you have an infection, however, I will obtain a urine culture to be sure. In the mean time, you can use over the counter Pyridium for your urinary pressure symptoms to see if that helps. Someone will call you regarding the results of your urine study.  Please schedule a follow-up if symptoms fail to improve in the next 5-7 days.  If you have any questions or concerns, please do not hesitate to call the office at [phone].  Sincerely,  Cordelia Poche, MD

## 2014-05-05 NOTE — Progress Notes (Signed)
   Subjective:    Patient ID: Andrea Robles, female    DOB: 05/29/1951, 63 y.o.   MRN: [id number]  HPI   Patient presents for same day visit with dysuria. Symptoms started 2 days ago. She has urgency, frequency and hesitancy. No fevers, chills, nausea or vomiting. No history of STIs. Symptoms have worsened in the passed two days. She has not tried anything to alleviate the symptoms and nothing aggravates them.  Social history: smoking history reviewed.  Review of Systems  Gastrointestinal: Negative for nausea and vomiting.  Genitourinary: Negative for dysuria, urgency, frequency, hematuria and flank pain.       Objective:   Physical Exam  Constitutional: She appears well-developed.  Abdominal: Soft. Normal appearance. There is tenderness in the suprapubic area. There is no rigidity, no rebound, no guarding and no CVA tenderness.          Assessment & Plan:

## 2014-05-05 NOTE — Assessment & Plan Note (Addendum)
Urinalysis not suggestive of UTI. Will follow-up urine culture. Pyridium for symptoms management in the mean time. No fever, or flank pain to suggest pyelonephritis.

## 2014-05-06 LAB — URINE CULTURE
Colony Count: NO GROWTH
Organism ID, Bacteria: NO GROWTH

## 2014-05-12 ENCOUNTER — Encounter: Payer: Self-pay | Admitting: Internal Medicine

## 2014-05-14 ENCOUNTER — Ambulatory Visit (INDEPENDENT_AMBULATORY_CARE_PROVIDER_SITE_OTHER): Payer: No Typology Code available for payment source | Admitting: Family Medicine

## 2014-05-14 VITALS — BP 140/74 | HR 56 | Temp 98.0°F | Resp 20 | Wt 209.0 lb

## 2014-05-14 DIAGNOSIS — R300 Dysuria: Secondary | ICD-10-CM

## 2014-05-14 DIAGNOSIS — R0989 Other specified symptoms and signs involving the circulatory and respiratory systems: Secondary | ICD-10-CM

## 2014-05-14 DIAGNOSIS — R252 Cramp and spasm: Secondary | ICD-10-CM

## 2014-05-14 DIAGNOSIS — E7800 Pure hypercholesterolemia, unspecified: Secondary | ICD-10-CM

## 2014-05-14 DIAGNOSIS — I10 Essential (primary) hypertension: Secondary | ICD-10-CM

## 2014-05-14 DIAGNOSIS — R0600 Dyspnea, unspecified: Secondary | ICD-10-CM | POA: Insufficient documentation

## 2014-05-14 DIAGNOSIS — R0609 Other forms of dyspnea: Secondary | ICD-10-CM

## 2014-05-14 LAB — COMPREHENSIVE METABOLIC PANEL
ALBUMIN: 4.1 g/dL (ref 3.5–5.2)
ALK PHOS: 59 U/L (ref 39–117)
ALT: 18 U/L (ref 0–35)
AST: 22 U/L (ref 0–37)
BUN: 14 mg/dL (ref 6–23)
CHLORIDE: 105 meq/L (ref 96–112)
CO2: 25 mEq/L (ref 19–32)
Calcium: 9 mg/dL (ref 8.4–10.5)
Creat: 0.89 mg/dL (ref 0.50–1.10)
GLUCOSE: 96 mg/dL (ref 70–99)
Potassium: 3.9 mEq/L (ref 3.5–5.3)
Sodium: 138 mEq/L (ref 135–145)
Total Bilirubin: 0.5 mg/dL (ref 0.2–1.2)
Total Protein: 7 g/dL (ref 6.0–8.3)

## 2014-05-14 LAB — POCT URINALYSIS DIPSTICK
Bilirubin, UA: NEGATIVE
Blood, UA: NEGATIVE
Glucose, UA: NEGATIVE
Ketones, UA: NEGATIVE
Leukocytes, UA: NEGATIVE
Nitrite, UA: NEGATIVE
Protein, UA: NEGATIVE
Spec Grav, UA: 1.01
Urobilinogen, UA: 0.2
pH, UA: 6

## 2014-05-14 LAB — TSH: TSH: 0.961 u[IU]/mL (ref 0.350–4.500)

## 2014-05-14 LAB — CK: Total CK: 85 U/L (ref 7–177)

## 2014-05-14 NOTE — Patient Instructions (Signed)
I will call tomorrow with the blood results Be very careful about any salt intake. Stop the cholesterol medicine - pravastatin - for one month to see what happens to the pain and cramping.

## 2014-05-15 ENCOUNTER — Encounter: Payer: Self-pay | Admitting: Family Medicine

## 2014-05-15 LAB — PRO B NATRIURETIC PEPTIDE: Pro B Natriuretic peptide (BNP): 326.1 pg/mL — ABNORMAL HIGH (ref <126)

## 2014-05-15 NOTE — Assessment & Plan Note (Signed)
Will check BNP as a screen for CHF with her dyspnea on exertion and leg swelling.

## 2014-05-15 NOTE — Assessment & Plan Note (Signed)
Check CK for pravastatin rhabdo.  Try off statins for 1 months to see impact on pain (statin induce myalgia.)

## 2014-05-15 NOTE — Assessment & Plan Note (Addendum)
Reemphasize low salt diet.  It sounds like she has room for improvement.  Also check TSH for due to her symptom constellation.

## 2014-05-15 NOTE — Progress Notes (Signed)
   Subjective:    Patient ID: Andrea Robles, female    DOB: [birth year], 63 y.o.   MRN: [id number]  HPI Andrea Robles has a number of possibly related symptoms. 1. She has bilateral leg swelling of recent onset (last few weeks.) 2. She has longstanding low back and bilateral leg pain which feels like it is more in the muscles than in the joints.  She states the pain has gotten worse since she has been on a statin.   3. She has slow, longstanding wt gain over the past few years. 4. She seems to have a bit less energy.   Review of Systems Denies chest pain, depressive symptoms, change in bowel or bladder, bleeding, cough, nausea, change in appetite.     Objective:   Physical ExamHEENT normal Neck no thyromegally Lungs clear Cardiac RRR without m or g Abd benign Ext 1+ bilateral edema, No joint swelling.  Indicates quads and lower lumbar region when asked about where pain is greatest.        Assessment & Plan:

## 2014-06-16 ENCOUNTER — Telehealth: Payer: Self-pay | Admitting: Family Medicine

## 2014-06-16 DIAGNOSIS — R252 Cramp and spasm: Secondary | ICD-10-CM

## 2014-06-16 DIAGNOSIS — E7800 Pure hypercholesterolemia, unspecified: Secondary | ICD-10-CM

## 2014-06-16 NOTE — Telephone Encounter (Signed)
Please advise.Thank you.Andrea Robles  

## 2014-06-16 NOTE — Telephone Encounter (Signed)
Was told to call back in a month. Dr Andria Frames said he would return her call

## 2014-06-17 NOTE — Assessment & Plan Note (Signed)
Improved off statin.

## 2014-06-17 NOTE — Assessment & Plan Note (Signed)
Will check dLDL off statin.

## 2014-07-09 ENCOUNTER — Ambulatory Visit (INDEPENDENT_AMBULATORY_CARE_PROVIDER_SITE_OTHER): Payer: No Typology Code available for payment source | Admitting: *Deleted

## 2014-07-09 ENCOUNTER — Other Ambulatory Visit: Payer: No Typology Code available for payment source

## 2014-07-09 DIAGNOSIS — E7800 Pure hypercholesterolemia, unspecified: Secondary | ICD-10-CM

## 2014-07-09 DIAGNOSIS — Z23 Encounter for immunization: Secondary | ICD-10-CM

## 2014-07-09 LAB — LDL CHOLESTEROL, DIRECT: LDL DIRECT: 142 mg/dL — AB

## 2014-07-09 NOTE — Progress Notes (Signed)
D-LDL DONE TODAY Andrea Robles

## 2014-07-10 ENCOUNTER — Other Ambulatory Visit: Payer: Self-pay | Admitting: Family Medicine

## 2014-07-10 DIAGNOSIS — E7800 Pure hypercholesterolemia, unspecified: Secondary | ICD-10-CM

## 2014-07-10 MED ORDER — SIMVASTATIN 10 MG PO TABS: 10.0000 mg | ORAL_TABLET | Freq: Every day | ORAL | Status: DC

## 2014-07-10 NOTE — Assessment & Plan Note (Signed)
She will try low dose simvastatin (has been on atorvastatin and pravastatin in the past with both causing leg pain.  Recheck d LDL in 3 months.

## 2014-08-13 ENCOUNTER — Telehealth: Payer: Self-pay | Admitting: Family Medicine

## 2014-08-13 DIAGNOSIS — E7800 Pure hypercholesterolemia, unspecified: Secondary | ICD-10-CM

## 2014-08-13 NOTE — Telephone Encounter (Signed)
Pt called and said that the Statin's that are being prescribed are not helping. She is having issues with her stomach and she would like to know what is the next step. jw

## 2014-08-14 MED ORDER — RED YEAST RICE 600 MG PO TABS: 600.0000 mg | ORAL_TABLET | Freq: Every day | ORAL | Status: DC

## 2014-08-14 NOTE — Assessment & Plan Note (Signed)
She is more convinced than I that she cannot tolerate statins.  Will try red yeast rice.

## 2014-09-08 ENCOUNTER — Ambulatory Visit: Payer: Self-pay

## 2014-09-11 ENCOUNTER — Ambulatory Visit: Payer: Self-pay

## 2014-10-26 ENCOUNTER — Other Ambulatory Visit: Payer: Self-pay | Admitting: Family Medicine

## 2014-10-31 ENCOUNTER — Ambulatory Visit (INDEPENDENT_AMBULATORY_CARE_PROVIDER_SITE_OTHER): Payer: 59 | Admitting: Family Medicine

## 2014-10-31 ENCOUNTER — Encounter: Payer: Self-pay | Admitting: Family Medicine

## 2014-10-31 ENCOUNTER — Encounter: Payer: Self-pay | Admitting: Internal Medicine

## 2014-10-31 VITALS — BP 135/75 | HR 68 | Ht 69.0 in | Wt 211.0 lb

## 2014-10-31 DIAGNOSIS — M545 Low back pain, unspecified: Secondary | ICD-10-CM

## 2014-10-31 DIAGNOSIS — R252 Cramp and spasm: Secondary | ICD-10-CM

## 2014-10-31 DIAGNOSIS — Z86010 Personal history of colonic polyps: Secondary | ICD-10-CM

## 2014-10-31 DIAGNOSIS — E7800 Pure hypercholesterolemia, unspecified: Secondary | ICD-10-CM

## 2014-10-31 DIAGNOSIS — I10 Essential (primary) hypertension: Secondary | ICD-10-CM

## 2014-10-31 LAB — BASIC METABOLIC PANEL
BUN: 17 mg/dL (ref 6–23)
CALCIUM: 9.7 mg/dL (ref 8.4–10.5)
CO2: 26 mEq/L (ref 19–32)
CREATININE: 1.11 mg/dL — AB (ref 0.50–1.10)
Chloride: 105 mEq/L (ref 96–112)
GLUCOSE: 122 mg/dL — AB (ref 70–99)
POTASSIUM: 4.5 meq/L (ref 3.5–5.3)
Sodium: 140 mEq/L (ref 135–145)

## 2014-10-31 LAB — LDL CHOLESTEROL, DIRECT: Direct LDL: 137 mg/dL — ABNORMAL HIGH

## 2014-10-31 MED ORDER — LORAZEPAM 0.5 MG PO TABS: 0.5000 mg | ORAL_TABLET | Freq: Two times a day (BID) | ORAL | Status: DC | PRN

## 2014-10-31 NOTE — Patient Instructions (Signed)
I am pleased with everything but the weight. Yes, you are due for a colonoscopy with [facility], Dr. Olevia Perches I will call next week with blood test results.   Keep up the good work

## 2014-10-31 NOTE — Progress Notes (Signed)
   Subjective:    Patient ID: Andrea Robles, female    DOB: 12-22-1950, 64 y.o.   MRN: [id number]  HPI Doing well.  Here for recheck. Needs repeat colonoscopy for polyps.  Sadly family is riddled with colon cancer including sister who recently died. Weight is creeping up. Off statin due to intolerance.  Needs check of ldl on red yeast rice.   Nicely, leg pain has resolved off statin.    Review of Systems     Objective:   Physical Exam Lungs clear Cardiac rrr without m or g       Assessment & Plan:

## 2014-10-31 NOTE — Assessment & Plan Note (Signed)
Check ldl

## 2014-10-31 NOTE — Assessment & Plan Note (Signed)
Due for screen.  Sadly, sister died of colon cancer

## 2014-10-31 NOTE — Assessment & Plan Note (Signed)
Resolved off statin. 

## 2014-10-31 NOTE — Assessment & Plan Note (Signed)
Leg pain resolved with stopping statin.

## 2014-10-31 NOTE — Assessment & Plan Note (Signed)
Well controled. 

## 2014-11-03 ENCOUNTER — Encounter: Payer: Self-pay | Admitting: Family Medicine

## 2014-11-27 ENCOUNTER — Other Ambulatory Visit: Payer: Self-pay

## 2014-11-27 DIAGNOSIS — Z1231 Encounter for screening mammogram for malignant neoplasm of breast: Secondary | ICD-10-CM

## 2014-12-01 ENCOUNTER — Ambulatory Visit (AMBULATORY_SURGERY_CENTER): Payer: Self-pay | Admitting: *Deleted

## 2014-12-01 VITALS — Ht 69.0 in | Wt 211.0 lb

## 2014-12-01 DIAGNOSIS — Z86010 Personal history of colonic polyps: Secondary | ICD-10-CM

## 2014-12-01 NOTE — Progress Notes (Signed)
Patient denies any allergies to eggs or soy. Patient denies any problems with anesthesia/sedation. Patient denies any oxygen use at home and does not take any diet/weight loss medications. EMMI education assisgned to patient on colonoscopy, this was explained and instructions given to patient. Patient request to do the Miralax prep. Like last time per pt.

## 2014-12-10 ENCOUNTER — Encounter: Payer: 59 | Admitting: Internal Medicine

## 2014-12-11 ENCOUNTER — Encounter: Payer: Self-pay | Admitting: Internal Medicine

## 2014-12-26 ENCOUNTER — Other Ambulatory Visit: Payer: Self-pay

## 2014-12-26 ENCOUNTER — Ambulatory Visit
Admission: RE | Admit: 2014-12-26 | Discharge: 2014-12-26 | Disposition: A | Discharge status: Home / Self Care | Payer: 59 | Source: Ambulatory Visit

## 2014-12-26 DIAGNOSIS — Z1231 Encounter for screening mammogram for malignant neoplasm of breast: Secondary | ICD-10-CM

## 2015-01-19 ENCOUNTER — Ambulatory Visit (AMBULATORY_SURGERY_CENTER): Payer: 59 | Admitting: Internal Medicine

## 2015-01-19 ENCOUNTER — Encounter: Payer: Self-pay | Admitting: Internal Medicine

## 2015-01-19 VITALS — BP 120/65 | HR 63 | Temp 98.2°F | Resp 14 | Ht 69.0 in | Wt 211.0 lb

## 2015-01-19 DIAGNOSIS — D122 Benign neoplasm of ascending colon: Secondary | ICD-10-CM | POA: Diagnosis not present

## 2015-01-19 DIAGNOSIS — Z86010 Personal history of colonic polyps: Secondary | ICD-10-CM | POA: Diagnosis not present

## 2015-01-19 DIAGNOSIS — K635 Polyp of colon: Secondary | ICD-10-CM | POA: Diagnosis not present

## 2015-01-19 DIAGNOSIS — D125 Benign neoplasm of sigmoid colon: Secondary | ICD-10-CM

## 2015-01-19 HISTORY — PX: COLONOSCOPY: SHX174

## 2015-01-19 MED ORDER — SODIUM CHLORIDE 0.9 % IV SOLN: 500.0000 mL | INTRAVENOUS | Status: DC

## 2015-01-19 NOTE — Op Note (Addendum)
[facility]  [address] [place], [id number]   COLONOSCOPY PROCEDURE REPORT  PATIENT: Andrea Robles, Andrea Robles  MR#: [id number] BIRTHDATE: Aug 22, 1951 , 23  yrs. old GENDER: female ENDOSCOPIST: Lafayette Dragon, MD REFERRED QM:[id number] Hensel, M.D. PROCEDURE DATE:  01/19/2015 PROCEDURE:   Colonoscopy, surveillance , Colonoscopy with cold biopsy polypectomy, and Colonoscopy with snare polypectomy First Screening Colonoscopy - Avg.  risk and is 50 yrs.  old or older - No.  Prior Negative Screening - Now for repeat screening. N/A  History of Adenoma - Now for follow-up colonoscopy & has been > or = to 3 yrs.  Yes hx of adenoma.  Has been 3 or more years since last colonoscopy. ASA CLASS:   Class II INDICATIONS:Surveillance due to prior colonic neoplasia, Colorectal Neoplasm Risk Assessment for this procedure is above average risk, and prior colonoscopy in September 2010 showed 2 adenomatous polyps and hyperplastic polyp.  patient's sister diagnosed with lung cancer metastatic from the colon. She passed away last year MEDICATIONS: Monitored anesthesia care and Propofol 320 mg IV  DESCRIPTION OF PROCEDURE:   After the risks benefits and alternatives of the procedure were thoroughly explained, informed consent was obtained.  The digital rectal exam revealed no abnormalities of the rectum.   The LB [id number]-H190 [id number]  endoscope was introduced through the anus and advanced to the cecum, which was identified by both the appendix and ileocecal valve. No adverse events experienced.   The quality of the prep was good.  (MoviPrep was used)  The instrument was then slowly withdrawn as the colon was fully examined.      COLON FINDINGS: Three smooth sessile polyps measuring 15 mm,x1 and 4 mm x2 in size were found in the sigmoid colon ( 4 mm x2) and ascending colon 15 mm smoothe polyp likely a lipoma, not removed- biopsies obtained.  A polypectomy was performed with cold forceps. The  resection was complete, the polyp tissue was completely retrieved and sent to histology.  A polypectomy was performed with a cold snare.  The resection was complete, the polyp tissue was completely retrieved and sent to histology.   There was mild diverticulosis noted in the sigmoid colon.  Retroflexed views revealed no abnormalities. The time to cecum = 6.10 Withdrawal time = 13.45   The scope was withdrawn and the procedure completed. COMPLICATIONS: There were no immediate complications.  ENDOSCOPIC IMPRESSION:  1.   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   Mild diverticulosis was noted in the sigmoid colon  RECOMMENDATIONS: Await pathology results high fiber diet Recall colonoscopy pending path report,in ,in view of the new history of colon cancer in her sister she will return in 5 years  eSigned:  Lafayette Dragon, MD 01/19/2015 9:13 AM Revised: 01/19/2015 9:13 AM  cc:   PATIENT NAME:  Andrea Robles MR#: [id number]

## 2015-01-19 NOTE — Progress Notes (Signed)
Called to room to assist during endoscopic procedure.  Patient ID and intended procedure confirmed with present staff. Received instructions for my participation in the procedure from the performing physician.  

## 2015-01-19 NOTE — Progress Notes (Signed)
A/ox3 pleased with MAC, report to Wendy RN 

## 2015-01-19 NOTE — Patient Instructions (Signed)
YOU HAD AN ENDOSCOPIC PROCEDURE TODAY AT [facility]:   Refer to the procedure report that was given to you for any specific questions about what was found during the examination.  If the procedure report does not answer your questions, please call your gastroenterologist to clarify.  If you requested that your care partner not be given the details of your procedure findings, then the procedure report has been included in a sealed envelope for you to review at your convenience later.  YOU SHOULD EXPECT: Some feelings of bloating in the abdomen. Passage of more gas than usual.  Walking can help get rid of the air that was put into your GI tract during the procedure and reduce the bloating. If you had a lower endoscopy (such as a colonoscopy or flexible sigmoidoscopy) you may notice spotting of blood in your stool or on the toilet paper. If you underwent a bowel prep for your procedure, you may not have a normal bowel movement for a few days.  Please Note:  You might notice some irritation and congestion in your nose or some drainage.  This is from the oxygen used during your procedure.  There is no need for concern and it should clear up in a day or so.  SYMPTOMS TO REPORT IMMEDIATELY:   Following lower endoscopy (colonoscopy or flexible sigmoidoscopy):  Excessive amounts of blood in the stool  Significant tenderness or worsening of abdominal pains  Swelling of the abdomen that is new, acute  Fever of 100F or higher  For urgent or emergent issues, a gastroenterologist can be reached at any hour by calling [phone].   DIET: Your first meal following the procedure should be a small meal and then it is ok to progress to your normal diet. Heavy or fried foods are harder to digest and may make you feel nauseous or bloated.  Likewise, meals heavy in dairy and vegetables can increase bloating.  Drink plenty of fluids but you should avoid alcoholic beverages for 24  hours.  ACTIVITY:  You should plan to take it easy for the rest of today and you should NOT DRIVE or use heavy machinery until tomorrow (because of the sedation medicines used during the test).    FOLLOW UP: Our staff will call the number listed on your records the next business day following your procedure to check on you and address any questions or concerns that you may have regarding the information given to you following your procedure. If we do not reach you, we will leave a message.  However, if you are feeling well and you are not experiencing any problems, there is no need to return our call.  We will assume that you have returned to your regular daily activities without incident.  If any biopsies were taken you will be contacted by phone or by letter within the next 1-3 weeks.  Please call us at [phone] if you have not heard about the biopsies in 3 weeks.    SIGNATURES/CONFIDENTIALITY: You and/or your care partner have signed paperwork which will be entered into your electronic medical record.  These signatures attest to the fact that that the information above on your After Visit Summary has been reviewed and is understood.  Full responsibility of the confidentiality of this discharge information lies with you and/or your care-partner.  Recommendations Discharge instructions given to patient and/or care partner. Polyp, diverticulosis, and high fiber diet handouts provided.

## 2015-01-20 ENCOUNTER — Telehealth: Payer: Self-pay | Admitting: *Deleted

## 2015-01-20 NOTE — Telephone Encounter (Signed)
Message left

## 2015-01-22 ENCOUNTER — Encounter: Payer: Self-pay | Admitting: Internal Medicine

## 2015-02-04 ENCOUNTER — Emergency Department (HOSPITAL_COMMUNITY)
Admission: EM | Admit: 2015-02-04 | Discharge: 2015-02-05 | Disposition: A | Discharge status: Home / Self Care | Payer: 59 | Attending: Emergency Medicine | Admitting: Emergency Medicine

## 2015-02-04 ENCOUNTER — Encounter (HOSPITAL_COMMUNITY): Payer: Self-pay | Admitting: *Deleted

## 2015-02-04 DIAGNOSIS — Z79899 Other long term (current) drug therapy: Secondary | ICD-10-CM | POA: Diagnosis not present

## 2015-02-04 DIAGNOSIS — Z87891 Personal history of nicotine dependence: Secondary | ICD-10-CM | POA: Insufficient documentation

## 2015-02-04 DIAGNOSIS — R079 Chest pain, unspecified: Secondary | ICD-10-CM | POA: Diagnosis present

## 2015-02-04 DIAGNOSIS — Z8639 Personal history of other endocrine, nutritional and metabolic disease: Secondary | ICD-10-CM | POA: Diagnosis not present

## 2015-02-04 DIAGNOSIS — I10 Essential (primary) hypertension: Secondary | ICD-10-CM | POA: Diagnosis not present

## 2015-02-04 DIAGNOSIS — Z7982 Long term (current) use of aspirin: Secondary | ICD-10-CM | POA: Diagnosis not present

## 2015-02-04 DIAGNOSIS — R0789 Other chest pain: Secondary | ICD-10-CM | POA: Insufficient documentation

## 2015-02-04 DIAGNOSIS — M549 Dorsalgia, unspecified: Secondary | ICD-10-CM | POA: Insufficient documentation

## 2015-02-04 NOTE — ED Notes (Signed)
Patient states she had 7 teeth pulled today.  Noticed her BP was elevated this afternoon.  States she laid down and woke up with CP  Denies nausea or SOB

## 2015-02-05 ENCOUNTER — Emergency Department (HOSPITAL_COMMUNITY): Payer: 59

## 2015-02-05 LAB — CBC WITH DIFFERENTIAL/PLATELET
Basophils Absolute: 0.1 10*3/uL (ref 0.0–0.1)
Basophils Relative: 0 % (ref 0–1)
Eosinophils Absolute: 0.2 10*3/uL (ref 0.0–0.7)
Eosinophils Relative: 2 % (ref 0–5)
HCT: 45.7 % (ref 36.0–46.0)
HEMOGLOBIN: 14.9 g/dL (ref 12.0–15.0)
Lymphocytes Relative: 31 % (ref 12–46)
Lymphs Abs: 3.4 10*3/uL (ref 0.7–4.0)
MCH: 29.4 pg (ref 26.0–34.0)
MCHC: 32.6 g/dL (ref 30.0–36.0)
MCV: 90.3 fL (ref 78.0–100.0)
MONO ABS: 0.7 10*3/uL (ref 0.1–1.0)
MONOS PCT: 6 % (ref 3–12)
Neutro Abs: 6.9 10*3/uL (ref 1.7–7.7)
Neutrophils Relative %: 61 % (ref 43–77)
Platelets: 265 10*3/uL (ref 150–400)
RBC: 5.06 MIL/uL (ref 3.87–5.11)
RDW: 13.8 % (ref 11.5–15.5)
WBC: 11.3 10*3/uL — ABNORMAL HIGH (ref 4.0–10.5)

## 2015-02-05 LAB — BASIC METABOLIC PANEL
ANION GAP: 14 (ref 5–15)
BUN: 15 mg/dL (ref 6–23)
CHLORIDE: 100 mmol/L (ref 96–112)
CO2: 23 mmol/L (ref 19–32)
Calcium: 9.3 mg/dL (ref 8.4–10.5)
Creatinine, Ser: 1.02 mg/dL (ref 0.50–1.10)
GFR calc Af Amer: 67 mL/min — ABNORMAL LOW (ref >90)
GFR calc non Af Amer: 58 mL/min — ABNORMAL LOW (ref >90)
Glucose, Bld: 131 mg/dL — ABNORMAL HIGH (ref 70–99)
Potassium: 3.7 mmol/L (ref 3.5–5.1)
Sodium: 137 mmol/L (ref 135–145)

## 2015-02-05 LAB — I-STAT TROPONIN, ED: Troponin i, poc: 0.01 ng/mL (ref 0.00–0.08)

## 2015-02-05 MED ORDER — TRAMADOL HCL 50 MG PO TABS: 50.0000 mg | ORAL_TABLET | Freq: Four times a day (QID) | ORAL | Status: DC | PRN

## 2015-02-05 NOTE — ED Notes (Signed)
Pt st's chest pain and back pain started after taking Norco for pain.  Pt denies any chest or back pain at this time.

## 2015-02-05 NOTE — Discharge Instructions (Signed)

## 2015-02-05 NOTE — ED Provider Notes (Signed)
CSN: [id number]     Arrival date & time 02/04/15  2332 History   First MD Initiated Contact with Patient 02/05/15 0216     This chart was scribed for Linton Flemings, MD by Forrestine Him, ED Scribe. This patient was seen in room D36C/D36C and the patient's care was started 2:41 AM.   Chief Complaint  Patient presents with  . Chest Pain   The history is provided by the patient. No language interpreter was used.    HPI Comments: Andrea Robles is a 64 y.o. female with a PMHx of HTN and hypercholesteremia who presents to the Emergency Department complaining of constant, sudden onset chest pain onset 9:00 PM yesterday evening 4/13. Pt had 7 teeth extracted yesterday and was sent home with Norco for pain control. Pt states she took 1 tablet after returning home from the dentist and states pain did not improve. She then called her dentist and was advised to try 2 tablets. Prior to bed, pt still only took 1 tablet and saved the second tablet in case she needed it. Shortly after bed pt states she felt some chest and back discomfort that "moved around like gas". At that time, pt decided to check her blood pressure with a reading of 177/90. Currently she is pain free. No recent fever, chills, SOB, nausea, or vomiting. Pt with known allergies to Nortriptyline, Pravastatin, Simvastatin, Famotidine, Lipitor, and Metronidazole.   Past Medical History  Diagnosis Date  . Hypertension   . Hypercholesteremia    Past Surgical History  Procedure Laterality Date  . Abdominal hysterectomy    . Cholecystectomy     Family History  Problem Relation Age of Onset  . Colon cancer Sister 101  . Lung cancer Sister   . Colon cancer Brother 56    METS  . Esophageal cancer Neg Hx   . Rectal cancer Neg Hx   . Stomach cancer Neg Hx    History  Substance Use Topics  . Smoking status: Former Smoker -- 0.50 packs/day for 44 years    Types: Cigarettes    Quit date: 03/27/2012  . Smokeless tobacco: Never Used  . Alcohol  Use: 0.0 oz/week     Comment: ocassionally   OB History    No data available     Review of Systems  Constitutional: Negative for fever and chills.  Respiratory: Negative for shortness of breath.   Cardiovascular: Positive for chest pain.  Gastrointestinal: Negative for nausea and vomiting.  Musculoskeletal: Positive for back pain.  Skin: Negative for rash.  Neurological: Negative for dizziness.  Psychiatric/Behavioral: Negative for confusion.      Allergies  Nortriptyline; Pravastatin; Simvastatin; Famotidine; Lipitor; and Metronidazole  Home Medications   Prior to Admission medications   Medication Sig Start Date End Date Taking? Authorizing Provider  aspirin 325 MG EC tablet Take 325 mg by mouth daily.   Yes Historical Provider, MD  Enalapril-Hydrochlorothiazide 5-12.5 MG per tablet TAKE ONE TABLET BY MOUTH ONCE DAILY   Yes Zenia Resides, MD  HYDROcodone-acetaminophen (NORCO/VICODIN) 5-325 MG per tablet Take 1 tablet by mouth every 6 (six) hours as needed for moderate pain.   Yes Historical Provider, MD  metoprolol (LOPRESSOR) 50 MG tablet Take 25 mg by mouth 2 (two) times daily.   Yes Historical Provider, MD  Omega-3 Fatty Acids (FISH OIL) 1000 MG CAPS Take 1,000 mg by mouth daily.   Yes Historical Provider, MD  Red Yeast Rice 600 MG TABS Take 1 tablet (600 mg total)  by mouth daily. 08/14/14  Yes Zenia Resides, MD  LORazepam (ATIVAN) 0.5 MG tablet Take 1 tablet (0.5 mg total) by mouth 2 (two) times daily as needed for anxiety. Patient not taking: Reported on 01/19/2015 10/31/14   Zenia Resides, MD   Triage Vitals: BP 132/51 mmHg  Pulse 72  Temp(Src) 98 F (36.7 C) (Oral)  Resp 20  Ht 5\' 9"  (1.753 m)  Wt 200 lb (90.719 kg)  BMI 29.52 kg/m2  SpO2 99%   Physical Exam  Constitutional: She is oriented to person, place, and time. She appears well-developed and well-nourished.  HENT:  Head: Normocephalic and atraumatic.  Nose: Nose normal.  Mouth/Throat:  Oropharynx is clear and moist.  Eyes: Conjunctivae and EOM are normal. Pupils are equal, round, and reactive to light.  Neck: Normal range of motion. Neck supple. No JVD present. No tracheal deviation present. No thyromegaly present.  Cardiovascular: Normal rate, regular rhythm, normal heart sounds and intact distal pulses.  Exam reveals no gallop and no friction rub.   No murmur heard. Pulmonary/Chest: Effort normal and breath sounds normal. No stridor. No respiratory distress. She has no wheezes. She has no rales. She exhibits no tenderness.  Abdominal: Soft. Bowel sounds are normal. She exhibits no distension and no mass. There is no tenderness. There is no rebound and no guarding.  Musculoskeletal: Normal range of motion. She exhibits no edema or tenderness.  Lymphadenopathy:    She has no cervical adenopathy.  Neurological: She is alert and oriented to person, place, and time. She displays normal reflexes. She exhibits normal muscle tone. Coordination normal.  Skin: Skin is warm and dry. No rash noted. No erythema. No pallor.  Psychiatric: She has a normal mood and affect. Her behavior is normal. Judgment and thought content normal.  Nursing note and vitals reviewed.   ED Course  Procedures (including critical care time)  DIAGNOSTIC STUDIES: Oxygen Saturation is 99% on RA, normal by my interpretation.    COORDINATION OF CARE: 2:37 AM-Discussed treatment plan with pt at bedside and pt agreed to plan.     Labs Review Labs Reviewed  CBC WITH DIFFERENTIAL/PLATELET - Abnormal; Notable for the following:    WBC 11.3 (*)    All other components within normal limits  BASIC METABOLIC PANEL - Abnormal; Notable for the following:    Glucose, Bld 131 (*)    GFR calc non Af Amer 58 (*)    GFR calc Af Amer 67 (*)    All other components within normal limits  I-STAT TROPOININ, ED    Imaging Review Dg Chest 2 View  02/05/2015   CLINICAL DATA:  Chest pain and high blood pressure  EXAM:  CHEST  2 VIEW  COMPARISON:  03/05/2013  FINDINGS: Chronic hyperinflation and interstitial coarsening. Normal heart size and stable mild aortic tortuosity. There is no edema, consolidation, effusion, or pneumothorax. A subtle nodular density overlapping the left upper chest has a appearance suggestive of EKG lead. No osseous findings to explain chest pain.  IMPRESSION: No active cardiopulmonary disease.   Electronically Signed   By: Monte Fantasia M.D.   On: 02/05/2015 02:52     EKG Interpretation   Date/Time:  Thursday February 05 2015 00:08:07 EDT Ventricular Rate:  67 PR Interval:  148 QRS Duration: 94 QT Interval:  403 QTC Calculation: 425 R Axis:   47 Text Interpretation:  Sinus rhythm Low voltage, precordial leads Confirmed  by Kunta Hilleary  MD, [place] ([id number]) on 02/05/2015 2:19:36 AM  MDM   Final diagnoses:  Atypical chest pain   64 year old female with epigastric and mid back pain tonight after laying down after taking Norco.  Patient had 7 teeth pulled today.  No problems with initial Norco, although she reports it did not help her pain much.  Patient is not taking Norco in the past.  She denies a shortness of breath, nausea, vomiting.  Patient has history of hypertension and hyperlipidemia, which is well-controlled.  No other risk factors for coronary disease.  She reports symptoms have resolved.  Patient is stable for discharge home.  I personally performed the services described in this documentation, which was scribed in my presence. The recorded information has been reviewed and is accurate.    Linton Flemings, MD 02/05/15 [phone]

## 2015-02-05 NOTE — ED Notes (Signed)
Pt returned from xray, denies pain and sob

## 2015-02-05 NOTE — ED Notes (Signed)
Denies sob and chest pain.  nad noted. Vss. Alert and oriented

## 2015-04-19 ENCOUNTER — Other Ambulatory Visit: Payer: Self-pay | Admitting: Family Medicine

## 2015-05-20 ENCOUNTER — Other Ambulatory Visit: Payer: Self-pay | Admitting: Family Medicine

## 2015-07-09 ENCOUNTER — Encounter: Payer: Self-pay | Admitting: Internal Medicine

## 2015-09-01 ENCOUNTER — Ambulatory Visit (INDEPENDENT_AMBULATORY_CARE_PROVIDER_SITE_OTHER): Payer: 59 | Admitting: Family Medicine

## 2015-09-01 ENCOUNTER — Telehealth: Payer: Self-pay | Admitting: Family Medicine

## 2015-09-01 ENCOUNTER — Encounter: Payer: Self-pay | Admitting: Family Medicine

## 2015-09-01 VITALS — BP 136/78 | HR 71 | Temp 98.4°F | Wt 200.6 lb

## 2015-09-01 DIAGNOSIS — J069 Acute upper respiratory infection, unspecified: Secondary | ICD-10-CM | POA: Insufficient documentation

## 2015-09-01 MED ORDER — HYDROCODONE-HOMATROPINE 5-1.5 MG/5ML PO SYRP: 5.0000 mL | ORAL_SOLUTION | Freq: Three times a day (TID) | ORAL | Status: DC | PRN

## 2015-09-01 MED ORDER — HYDROCODONE-HOMATROPINE 5-1.5 MG PO TABS: 1.0000 | ORAL_TABLET | Freq: Four times a day (QID) | ORAL | Status: DC | PRN

## 2015-09-01 NOTE — Assessment & Plan Note (Signed)
Likely viral URI given VSS and lungs clear. Previous smoker over 4 years ago with COPD in problem list but no SOB or sputum production to indicate COPD exacerbation.  - Will treat symptomatically - See AVS - Return precautions given

## 2015-09-01 NOTE — Patient Instructions (Signed)
Your symptoms are due to a viral illness. Antibiotics will not help improve your symptoms, but the following will help you feel better while your body fights the virus.   Drink lots of water or warm tea with honey; (Guaifenesin "Mucinex")  Nasal Saline Spray  Congestion:   Oral: Pseudoephedrine  Sneezing & Runny nose: Antihistamines: Zyrtec, Claritin, Allegra  Pain/Sore throat: Tylenol 650mg  every 6 hours as needed, Ibuprofen 600mg  every 6 hours as needed  Cough: Hycodan every 6 hours as needed  Wash your hands often to prevent spreading the virus  Call or return to clinic if you develop fevers greater than 102, or are not getting better in 7-10 days

## 2015-09-01 NOTE — Progress Notes (Signed)
   Subjective:    Patient ID: DESHAUN SCHOU, female    DOB: Feb 12, 1951, 64 y.o.   MRN: [id number]  Seen for Same day visit for   CC: cough  COUGH  Has been coughing for 3 days. Cough is: nonproductive Sputum production: NA Medications tried: tylenol Taking blood pressure medications: yes  Symptoms Runny nose: yes Mucous in back of throat: yes Throat burning or reflux: no Wheezing or asthma: no Fever: no Chest Pain: yes with coughing Shortness of breath: no Leg swelling: no Hemoptysis: no Weight loss: no  ROS see HPI Smoking Status noted  Objective:  BP 136/78 mmHg  Pulse 71  Temp(Src) 98.4 F (36.9 C) (Oral)  Wt 200 lb 9.6 oz (90.992 kg)  SpO2 98%  General: NAD HEENT: Swollen nasal turbinates bilaterally; pharyngeal erythema w/o exudates Cardiac: RRR, normal heart sounds, no murmurs. 2+ radial and PT pulses bilaterally Respiratory: CTAB, normal effort    Assessment & Plan:   URI (upper respiratory infection) Likely viral URI given VSS and lungs clear. Previous smoker over 4 years ago with COPD in problem list but no SOB or sputum production to indicate COPD exacerbation.  - Will treat symptomatically - See AVS - Return precautions given

## 2015-09-01 NOTE — Telephone Encounter (Signed)
Spoke with provider and he spoke with pharmacy.  Plan to change patient from tablets to liquid.  She is aware of this and will come by office to pick up script. Jazmin Hartsell,CMA

## 2015-09-01 NOTE — Telephone Encounter (Signed)
Pt says the RX given today is not available. Pharmacy says it can be ordered but it would take 2 weeks

## 2015-09-11 ENCOUNTER — Ambulatory Visit (INDEPENDENT_AMBULATORY_CARE_PROVIDER_SITE_OTHER): Payer: 59 | Admitting: Family Medicine

## 2015-09-11 ENCOUNTER — Encounter: Payer: Self-pay | Admitting: Family Medicine

## 2015-09-11 VITALS — BP 156/135 | HR 68 | Temp 98.7°F | Wt 198.0 lb

## 2015-09-11 DIAGNOSIS — B9689 Other specified bacterial agents as the cause of diseases classified elsewhere: Secondary | ICD-10-CM

## 2015-09-11 DIAGNOSIS — J019 Acute sinusitis, unspecified: Secondary | ICD-10-CM | POA: Diagnosis not present

## 2015-09-11 MED ORDER — AMOXICILLIN-POT CLAVULANATE 875-125 MG PO TABS: 1.0000 | ORAL_TABLET | Freq: Two times a day (BID) | ORAL | Status: AC

## 2015-09-11 NOTE — Patient Instructions (Signed)
We will treat you with an antibiotic for what is most likely now a bacterial sinus infection.  Take the augmentin twice a day for 7 days.  Continue with all of your other symptom medicines to help with the runny nose, cough, fevers.

## 2015-09-11 NOTE — Progress Notes (Signed)
   Subjective:    Patient ID: Andrea Robles, female    DOB: 1951/01/12, 64 y.o.   MRN: [id number]  HPI  Patient presents for Same Day Appointment  CC: cough  # Cough:  Patient was seen 10 days ago by Dr. Berkley Harvey for same issue. At that point she had been having nonproductive cough for 3 days along with URI symptoms including runny nose. She was diagnosed with a viral infection and recommended supportive care.  Patient reports today that she has continued to have severe symptoms including headache runny nose pain in between her eyes and her cheeks. She additionally has some pain in both ears, worse in the left. She has continued to have cough that'll wake her up at night despite use of the Hycodan syrup. She also has a sore throat and has a decreased appetite, she states she has lost 2 pounds in the past 10 days. ROS: Intermittent fevers, sore throat, weight loss, headache, no shortness of breath  Social Hx: Former smoker quit 4 years  Review of Systems   See HPI for ROS. All other systems reviewed and are negative.  Past medical history, surgical, family, and social history reviewed and updated in the EMR as appropriate.  Objective:  BP 156/135 mmHg  Pulse 68  Temp(Src) 98.7 F (37.1 C) (Oral)  Wt 198 lb (89.812 kg)  SpO2 97% Vitals and nursing note reviewed  General: uncomfortable appearing female appears older than stated age Eyes: PERRL, EOMI. Conjunctiva normal, sclera slightly darkened bilaterally. ENTM:  TMs are pearly gray bilaterally, left TM small effusion present, right TM no effusion. There is tenderness to palpation of the maxillary and frontal sinus. There is clear rhinorrhea present in both nares, turbinates are erythematous and boggy bilaterally. Posterior pharynx is mildly erythematous but no exudate. CV: Regular rate and rhythm no murmur rub or gallop. 2+ radial pulses  Resp: clear bilaterally without wheezes or rhonchi, normal effort Neuro: Alert and oriented,  normal gait, no deficits noted Psych: Normal thought content and speech  Assessment & Plan:  1. Acute bacterial sinusitis 13 day history with worsened symptoms, now having frontal headache and pressure in sinuses. Ear pain persists. Suspect now bacterial rhinosinusitis, will treat 7 days augmentin 875-125 twice daily. Follow up if not improving 2 weeks. Patient already has flu shot this year.

## 2016-02-22 ENCOUNTER — Other Ambulatory Visit: Payer: Self-pay | Admitting: *Deleted

## 2016-02-22 MED ORDER — ENALAPRIL-HYDROCHLOROTHIAZIDE 5-12.5 MG PO TABS: 1.0000 | ORAL_TABLET | Freq: Every day | ORAL | Status: DC

## 2016-07-13 ENCOUNTER — Ambulatory Visit (INDEPENDENT_AMBULATORY_CARE_PROVIDER_SITE_OTHER): Payer: BLUE CROSS/BLUE SHIELD | Admitting: Family Medicine

## 2016-07-13 ENCOUNTER — Encounter: Payer: Self-pay | Admitting: Family Medicine

## 2016-07-13 VITALS — BP 152/81 | HR 72 | Temp 98.4°F | Ht 69.0 in | Wt 203.0 lb

## 2016-07-13 DIAGNOSIS — I10 Essential (primary) hypertension: Secondary | ICD-10-CM | POA: Diagnosis not present

## 2016-07-13 DIAGNOSIS — Z23 Encounter for immunization: Secondary | ICD-10-CM | POA: Diagnosis not present

## 2016-07-13 DIAGNOSIS — Z0000 Encounter for general adult medical examination without abnormal findings: Secondary | ICD-10-CM

## 2016-07-13 DIAGNOSIS — E7800 Pure hypercholesterolemia, unspecified: Secondary | ICD-10-CM

## 2016-07-13 DIAGNOSIS — Z1159 Encounter for screening for other viral diseases: Secondary | ICD-10-CM | POA: Diagnosis not present

## 2016-07-13 LAB — CBC
HCT: 45.3 % — ABNORMAL HIGH (ref 35.0–45.0)
Hemoglobin: 15.1 g/dL (ref 11.7–15.5)
MCH: 30.2 pg (ref 27.0–33.0)
MCHC: 33.3 g/dL (ref 32.0–36.0)
MCV: 90.6 fL (ref 80.0–100.0)
MPV: 10.3 fL (ref 7.5–12.5)
PLATELETS: 274 10*3/uL (ref 140–400)
RBC: 5 MIL/uL (ref 3.80–5.10)
RDW: 14.1 % (ref 11.0–15.0)
WBC: 7 10*3/uL (ref 3.8–10.8)

## 2016-07-13 LAB — HIV ANTIBODY (ROUTINE TESTING W REFLEX): HIV: NONREACTIVE

## 2016-07-13 NOTE — Patient Instructions (Addendum)
I will call with lab work results. Please keep the weight under control. Flu shot today.

## 2016-07-13 NOTE — Assessment & Plan Note (Signed)
Check labs 

## 2016-07-13 NOTE — Assessment & Plan Note (Signed)
Check home BPs and only increase meds if consistently elevated.

## 2016-07-13 NOTE — Assessment & Plan Note (Signed)
Generally healthy woman.  No sig at risk behaviors.

## 2016-07-13 NOTE — Assessment & Plan Note (Signed)
HIV screen.  Also Hep C screen.

## 2016-07-13 NOTE — Progress Notes (Signed)
   Subjective:    Patient ID: Andrea Robles, female    DOB: Nov 08, 1950, 65 y.o.   MRN: [id number]  HPI Annual health maint.  Plus problems 1. High cholesterol.  Takes red yeast rice.  Due for recheck. 2. Overweight BMI 29.  FHx of DM.  Wants screened. 3. HBP Home BPs OK.  Elevated here. 4. HPDP never screened for HIV or Hep C.  Does need flu shot. 5. COPD.  Quit smoking 5 years ago.  Mild.  No frequent wheezing or cough.      Review of Systems Denies wt, appetite, stool change.  Denies SOB, CP, bleeding, fever, night sweats.     Objective:   Physical Exam HEENT normal Neck supple Lungs clear Cardiac RRR without m or g abd benign Ext no edema.  Normal pulses. Neuro.  Motor and sensory grossly intact.        Assessment & Plan:

## 2016-07-14 LAB — COMPLETE METABOLIC PANEL WITH GFR
ALBUMIN: 4.4 g/dL (ref 3.6–5.1)
ALT: 11 U/L (ref 6–29)
AST: 17 U/L (ref 10–35)
Alkaline Phosphatase: 64 U/L (ref 33–130)
BILIRUBIN TOTAL: 0.5 mg/dL (ref 0.2–1.2)
BUN: 18 mg/dL (ref 7–25)
CO2: 23 mmol/L (ref 20–31)
Calcium: 9.5 mg/dL (ref 8.6–10.4)
Chloride: 105 mmol/L (ref 98–110)
Creat: 1.06 mg/dL — ABNORMAL HIGH (ref 0.50–0.99)
GFR, EST NON AFRICAN AMERICAN: 56 mL/min — AB (ref >=60)
GFR, Est African American: 64 mL/min (ref >=60)
GLUCOSE: 116 mg/dL — AB (ref 65–99)
Potassium: 4.1 mmol/L (ref 3.5–5.3)
SODIUM: 139 mmol/L (ref 135–146)
TOTAL PROTEIN: 7.4 g/dL (ref 6.1–8.1)

## 2016-07-14 LAB — LIPID PANEL
CHOL/HDL RATIO: 4.5 ratio (ref <=5.0)
Cholesterol: 239 mg/dL — ABNORMAL HIGH (ref 125–200)
HDL: 53 mg/dL (ref >=46)
LDL Cholesterol: 157 mg/dL — ABNORMAL HIGH (ref <130)
Triglycerides: 144 mg/dL (ref <150)
VLDL: 29 mg/dL (ref <30)

## 2016-07-14 LAB — HEPATITIS C ANTIBODY: HCV Ab: NEGATIVE

## 2016-07-14 MED ORDER — ROSUVASTATIN CALCIUM 10 MG PO TABS: 10.0000 mg | ORAL_TABLET | Freq: Every day | ORAL | 9 refills | Status: DC

## 2016-07-14 NOTE — Addendum Note (Signed)
Addended by: Zenia Resides on: 07/14/2016 04:28 PM   Modules accepted: Orders

## 2016-07-28 ENCOUNTER — Other Ambulatory Visit: Payer: Self-pay | Admitting: Family Medicine

## 2016-07-28 DIAGNOSIS — E7800 Pure hypercholesterolemia, unspecified: Secondary | ICD-10-CM

## 2016-07-28 MED ORDER — ROSUVASTATIN CALCIUM 10 MG PO TABS: 10.0000 mg | ORAL_TABLET | Freq: Every day | ORAL | 3 refills | Status: DC

## 2016-07-28 NOTE — Assessment & Plan Note (Signed)
Tolerating crestor well  

## 2016-08-26 ENCOUNTER — Telehealth: Payer: Self-pay | Admitting: Family Medicine

## 2016-08-26 MED ORDER — RED YEAST RICE EXTRACT 600 MG PO CAPS: 600.0000 mg | ORAL_CAPSULE | Freq: Two times a day (BID) | ORAL | 0 refills | Status: DC

## 2016-08-26 NOTE — Telephone Encounter (Signed)
Pt is calling and would like to speak to Dr. Andria Frames about this new medication Crestor. She doesn't like how it makes her feel. She would like to discuss what other options there are. jw

## 2016-08-26 NOTE — Telephone Encounter (Signed)
Could not tolerate crestor due to diffuse muscle pains.  Previously failed multiple statins.  Tolerated red yeast rice 600 mg daily with inadaquate response.  Will go back on red yeast rice, 600 mg bid.

## 2016-09-04 ENCOUNTER — Other Ambulatory Visit: Payer: Self-pay | Admitting: Family Medicine

## 2016-09-05 MED ORDER — METOPROLOL TARTRATE 25 MG PO TABS: 25.0000 mg | ORAL_TABLET | Freq: Two times a day (BID) | ORAL | 3 refills | Status: DC

## 2016-09-05 NOTE — Telephone Encounter (Signed)
Call, verified dose and told I would prescribe 25 mg tabs so no longer break in half.

## 2016-11-03 ENCOUNTER — Other Ambulatory Visit: Payer: Self-pay | Admitting: Family Medicine

## 2016-11-03 ENCOUNTER — Ambulatory Visit (INDEPENDENT_AMBULATORY_CARE_PROVIDER_SITE_OTHER): Payer: BLUE CROSS/BLUE SHIELD | Admitting: Family Medicine

## 2016-11-03 ENCOUNTER — Ambulatory Visit
Admission: RE | Admit: 2016-11-03 | Discharge: 2016-11-03 | Disposition: A | Discharge status: Home / Self Care | Payer: BLUE CROSS/BLUE SHIELD | Source: Ambulatory Visit | Attending: Family Medicine | Admitting: Family Medicine

## 2016-11-03 ENCOUNTER — Encounter: Payer: Self-pay | Admitting: Family Medicine

## 2016-11-03 DIAGNOSIS — M25551 Pain in right hip: Secondary | ICD-10-CM

## 2016-11-03 DIAGNOSIS — E7800 Pure hypercholesterolemia, unspecified: Secondary | ICD-10-CM | POA: Diagnosis not present

## 2016-11-03 DIAGNOSIS — M1611 Unilateral primary osteoarthritis, right hip: Secondary | ICD-10-CM | POA: Insufficient documentation

## 2016-11-03 DIAGNOSIS — I10 Essential (primary) hypertension: Secondary | ICD-10-CM

## 2016-11-03 DIAGNOSIS — R739 Hyperglycemia, unspecified: Secondary | ICD-10-CM | POA: Diagnosis not present

## 2016-11-03 DIAGNOSIS — M545 Low back pain, unspecified: Secondary | ICD-10-CM

## 2016-11-03 LAB — LDL CHOLESTEROL, DIRECT: Direct LDL: 146 mg/dL — ABNORMAL HIGH (ref <130)

## 2016-11-03 LAB — TSH: TSH: 1.07 m[IU]/L

## 2016-11-03 LAB — POCT GLYCOSYLATED HEMOGLOBIN (HGB A1C): Hemoglobin A1C: 5.7

## 2016-11-03 NOTE — Progress Notes (Signed)
   Subjective:    Patient ID: Andrea Robles, female    DOB: Nov 06, 1950, 66 y.o.   MRN: [id number]  HPI Several issues. 1. FU LDL.  Now taking red yest rice.  Previously elevated LDL.  She is non diabetic and primary prevention. 2. Fell 1.5 years ago.  Initially had left sided pain.  Now right hip pain - likely unrelated to the fall.  Some right back pain.  Never had hip or lumbar x rays.  Known cervical spine arthritis.  No weakness or numbness, just hip pain    Review of Systems     Objective:   Physical ExamLungs clear Cardiac RRR without m or g Abd benign. Hip decrease internal and external rotation on right Low back, loss of lumbar lordosis. Ext DTRs, sensation and strength normal in both lower ext.        Assessment & Plan:

## 2016-11-03 NOTE — Patient Instructions (Signed)
Get back to eating right and more activity.  Your weight has crept up. I will call with the x ray and blood test results.

## 2016-11-03 NOTE — Progress Notes (Signed)
Subjective:     Patient ID: Andrea Robles, female   DOB: 1951-08-31, 66 y.o.   MRN: [id number]  HPI   Review of Systems     Objective:   Physical Exam     Assessment:     Template entered in error    Plan:     Template entered in error

## 2016-11-05 MED ORDER — ATORVASTATIN CALCIUM 10 MG PO TABS
10.0000 mg | ORAL_TABLET | Freq: Every day | ORAL | 12 refills | Status: DC
Start: 2016-11-05 — End: 2017-10-08

## 2016-11-07 NOTE — Assessment & Plan Note (Signed)
Unclear if hip or low back with normal x rays.  She will focus on wt loss and general fitness.

## 2016-11-07 NOTE — Assessment & Plan Note (Signed)
We disccussed results and she will retry atorvastatin at its lowest dose.

## 2017-04-14 ENCOUNTER — Other Ambulatory Visit: Payer: Self-pay | Admitting: Family Medicine

## 2017-04-27 ENCOUNTER — Encounter: Payer: Self-pay | Admitting: Family Medicine

## 2017-04-27 ENCOUNTER — Other Ambulatory Visit: Payer: Self-pay | Admitting: Family Medicine

## 2017-04-27 ENCOUNTER — Ambulatory Visit (INDEPENDENT_AMBULATORY_CARE_PROVIDER_SITE_OTHER): Payer: Medicare HMO | Admitting: Family Medicine

## 2017-04-27 VITALS — BP 128/76 | HR 60 | Temp 97.9°F | Ht 69.0 in | Wt 206.6 lb

## 2017-04-27 DIAGNOSIS — Z23 Encounter for immunization: Secondary | ICD-10-CM

## 2017-04-27 DIAGNOSIS — M545 Low back pain, unspecified: Secondary | ICD-10-CM

## 2017-04-27 DIAGNOSIS — E7800 Pure hypercholesterolemia, unspecified: Secondary | ICD-10-CM

## 2017-04-27 DIAGNOSIS — I10 Essential (primary) hypertension: Secondary | ICD-10-CM

## 2017-04-27 DIAGNOSIS — Z1231 Encounter for screening mammogram for malignant neoplasm of breast: Secondary | ICD-10-CM

## 2017-04-27 MED ORDER — CYCLOBENZAPRINE HCL 10 MG PO TABS: 10.0000 mg | ORAL_TABLET | Freq: Every day | ORAL | 3 refills | Status: DC

## 2017-04-27 MED ORDER — CELECOXIB 100 MG PO CAPS: 100.0000 mg | ORAL_CAPSULE | Freq: Two times a day (BID) | ORAL | 12 refills | Status: DC

## 2017-04-27 NOTE — Assessment & Plan Note (Signed)
Check LDL and based on that result, consider increasing to 20 mg dose.

## 2017-04-27 NOTE — Assessment & Plan Note (Signed)
celebrex and nightime flexeril.

## 2017-04-27 NOTE — Progress Notes (Signed)
   Subjective:    Patient ID: Andrea Robles, female    DOB: 17-Apr-1951, 66 y.o.   MRN: [id number]  HPI Several issues: 1. High cholesterol with prior statin intollerance.  Great news: she is tolerating atorvastin 10 well (did not tolerate 40 mg dose.) 2. Chronic back problems.  Wants to discuss meds.  Not seeking anything controled.  Having daily back pain.  Particularly bad at night and interferes with sleep.  Wanted to know about back brace which I discouraged.   3. Mild ankle swelling. Admits to dietary indiscretion with salt.   4. Behind on some health maint, partly because she just turned 66.  Needs mammo and Prevnar.      Review of Systems     Objective:   Physical Exam  Lungs clear Cardiac RRR without m or g Lumbar spine.  Moderate stiffness and decreased ROM Ext trace edema.        Assessment & Plan:

## 2017-04-27 NOTE — Assessment & Plan Note (Signed)
Well controled despite salt intake.

## 2017-04-27 NOTE — Patient Instructions (Addendum)
Get your mammogram done The nurse will check about pneumonia vaccines.   I sent in a prescription for back pain pills and muscle relaxer. Please cut back on the salt.   I will call with the lab test results.

## 2017-04-28 LAB — LDL CHOLESTEROL, DIRECT: LDL Direct: 89 mg/dL (ref 0–99)

## 2017-05-03 ENCOUNTER — Ambulatory Visit
Admission: RE | Admit: 2017-05-03 | Discharge: 2017-05-03 | Disposition: A | Discharge status: Home / Self Care | Payer: Medicare HMO | Source: Ambulatory Visit | Attending: Family Medicine | Admitting: Family Medicine

## 2017-05-03 DIAGNOSIS — Z1231 Encounter for screening mammogram for malignant neoplasm of breast: Secondary | ICD-10-CM | POA: Diagnosis not present

## 2017-06-07 ENCOUNTER — Ambulatory Visit (INDEPENDENT_AMBULATORY_CARE_PROVIDER_SITE_OTHER): Payer: Medicare HMO | Admitting: Family Medicine

## 2017-06-07 ENCOUNTER — Encounter: Payer: Self-pay | Admitting: Family Medicine

## 2017-06-07 DIAGNOSIS — L570 Actinic keratosis: Secondary | ICD-10-CM | POA: Diagnosis not present

## 2017-06-07 DIAGNOSIS — M545 Low back pain: Secondary | ICD-10-CM | POA: Diagnosis not present

## 2017-06-07 DIAGNOSIS — G8929 Other chronic pain: Secondary | ICD-10-CM

## 2017-06-07 MED ORDER — TRAMADOL HCL 50 MG PO TABS: 50.0000 mg | ORAL_TABLET | Freq: Three times a day (TID) | ORAL | 1 refills | Status: DC | PRN

## 2017-06-07 NOTE — Patient Instructions (Addendum)
I froze all three areas.  If any of them come back, I may want to biopsy them.  Lets see how they after the freezing. We will try tramadol for your back pain.  Limit of 30 per month.

## 2017-06-08 DIAGNOSIS — L570 Actinic keratosis: Secondary | ICD-10-CM | POA: Insufficient documentation

## 2017-06-08 NOTE — Progress Notes (Signed)
   Subjective:    Patient ID: Andrea Robles, female    DOB: [birth year], 67 y.o.   MRN: [id number]  HPI Andrea Robles has three areas on her back that will not go away.  Husband feels them as rough areas when he massages her back.  Occasionally get irritated and bleed.  Fair skinned and has had considerable sun exposure.  Could not take flexeril (too sleepy) and celebrex did not help back pain.    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  A third area between the two that appears to be a recently irritated AK with some redness but no purulence of DC.  Froze all three areas.        Assessment & Plan:

## 2017-06-08 NOTE — Assessment & Plan Note (Signed)
AK by clinic dx.  Froze all three to a depth that they should not recur.  Consider biopsy if recurs to confirm dx.

## 2017-06-08 NOTE — Assessment & Plan Note (Signed)
Requests something for bad days.  Discussed no narcotics and limited options.  Will give tramadol at a dose of 30 per month.

## 2017-07-11 ENCOUNTER — Other Ambulatory Visit: Payer: Self-pay | Admitting: Family Medicine

## 2017-07-26 ENCOUNTER — Ambulatory Visit (INDEPENDENT_AMBULATORY_CARE_PROVIDER_SITE_OTHER): Payer: Medicare HMO | Admitting: *Deleted

## 2017-07-26 DIAGNOSIS — Z23 Encounter for immunization: Secondary | ICD-10-CM

## 2017-07-26 DIAGNOSIS — M1711 Unilateral primary osteoarthritis, right knee: Secondary | ICD-10-CM | POA: Diagnosis not present

## 2017-07-26 DIAGNOSIS — M545 Low back pain: Secondary | ICD-10-CM | POA: Diagnosis not present

## 2017-07-26 DIAGNOSIS — M1712 Unilateral primary osteoarthritis, left knee: Secondary | ICD-10-CM | POA: Diagnosis not present

## 2017-07-26 DIAGNOSIS — M5137 Other intervertebral disc degeneration, lumbosacral region: Secondary | ICD-10-CM | POA: Diagnosis not present

## 2017-08-27 ENCOUNTER — Other Ambulatory Visit: Payer: Self-pay | Admitting: Family Medicine

## 2017-10-08 ENCOUNTER — Other Ambulatory Visit: Payer: Self-pay | Admitting: Family Medicine

## 2017-10-08 DIAGNOSIS — E7800 Pure hypercholesterolemia, unspecified: Secondary | ICD-10-CM

## 2017-10-25 ENCOUNTER — Encounter: Payer: Self-pay | Admitting: Family Medicine

## 2017-10-25 ENCOUNTER — Ambulatory Visit (INDEPENDENT_AMBULATORY_CARE_PROVIDER_SITE_OTHER): Payer: Medicare HMO | Admitting: Family Medicine

## 2017-10-25 ENCOUNTER — Ambulatory Visit
Admission: RE | Admit: 2017-10-25 | Discharge: 2017-10-25 | Disposition: A | Discharge status: Home / Self Care | Payer: Medicare HMO | Source: Ambulatory Visit | Attending: Family Medicine | Admitting: Family Medicine

## 2017-10-25 ENCOUNTER — Other Ambulatory Visit: Payer: Self-pay

## 2017-10-25 DIAGNOSIS — R109 Unspecified abdominal pain: Secondary | ICD-10-CM | POA: Insufficient documentation

## 2017-10-25 DIAGNOSIS — R05 Cough: Secondary | ICD-10-CM | POA: Diagnosis not present

## 2017-10-25 NOTE — Progress Notes (Signed)
Subjective  Patient is presenting with the following illnesses  URI  Major symptoms: R flank pain cough yellow sputum Has been sick for 6 days. Progression: not getting better Medications tried: tylenol benadryl Sick contacts: husband Patient believes may be caused by sinus  Symptoms Fever: no Headache or face pain: mild Tooth pain: no Sneezing: mild Scratchy throat: mild Allergies: no Muscle aches: just in R back Severe fatigue: yes Stiff neck: no Shortness of breath: no Rash: no Sore throat or swollen glands: no   ROS see HPI Smoking Status noted     Chief Complaint noted Review of Symptoms - see HPI PMH - Smoking status noted.     Objective Vital Signs reviewed Heart - Regular rate and rhythm.  No murmurs, gallops or rubs.    Throat: normal mucosa, no exudate, uvula midline, no redness Neck:  No deformities, thyromegaly, masses, or tenderness noted.   Supple with full range of motion without pain. Lungs - breath sounds less on R lower lung  No wheeze no resp distress Skin:  Intact without suspicious lesions or rashes     Assessments/Plans  Right flank pain Given resp symptoms and focal pain with decreased breath sounds checked xray which was normal. Treat symptomatically   I spoke with her via phone about the xray   See after visit summary for details of patient instuctions

## 2017-10-25 NOTE — Assessment & Plan Note (Signed)
Given resp symptoms and focal pain with decreased breath sounds checked xray which was normal. Treat symptomatically

## 2017-10-25 NOTE — Patient Instructions (Signed)
Good to see you today!  Thanks for coming in.  I will call you about the xray - [phone]  If you do not have a pneumonia then use Afrin nasal spray and nasal saline   If you are not better in a week or worsening with high fever or shortness of breath then call us

## 2017-12-27 ENCOUNTER — Ambulatory Visit (INDEPENDENT_AMBULATORY_CARE_PROVIDER_SITE_OTHER): Payer: Medicare HMO | Admitting: Family Medicine

## 2017-12-27 VITALS — BP 123/60 | HR 64 | Temp 97.6°F | Wt 208.8 lb

## 2017-12-27 DIAGNOSIS — E7800 Pure hypercholesterolemia, unspecified: Secondary | ICD-10-CM

## 2017-12-27 DIAGNOSIS — R109 Unspecified abdominal pain: Secondary | ICD-10-CM

## 2017-12-27 DIAGNOSIS — M545 Low back pain: Secondary | ICD-10-CM

## 2017-12-27 DIAGNOSIS — N898 Other specified noninflammatory disorders of vagina: Secondary | ICD-10-CM

## 2017-12-27 DIAGNOSIS — G8929 Other chronic pain: Secondary | ICD-10-CM

## 2017-12-27 LAB — POCT WET PREP (WET MOUNT)
Clue Cells Wet Prep Whiff POC: NEGATIVE
TRICHOMONAS WET PREP HPF POC: ABSENT

## 2017-12-27 LAB — POCT URINALYSIS DIP (MANUAL ENTRY)
Bilirubin, UA: NEGATIVE
Blood, UA: NEGATIVE
Glucose, UA: NEGATIVE mg/dL
Ketones, POC UA: NEGATIVE mg/dL
LEUKOCYTES UA: NEGATIVE
NITRITE UA: NEGATIVE
PH UA: 7 (ref 5.0–8.0)
Protein Ur, POC: NEGATIVE mg/dL
Spec Grav, UA: 1.015 (ref 1.010–1.025)
Urobilinogen, UA: 0.2 E.U./dL

## 2017-12-27 NOTE — Progress Notes (Signed)
dip 

## 2017-12-27 NOTE — Patient Instructions (Signed)
I am not sure. I will call with lab test results. See me in two weeks to decide how hard to jump on this.

## 2017-12-28 LAB — CMP14+EGFR
A/G RATIO: 1.4 (ref 1.2–2.2)
ALBUMIN: 4.4 g/dL (ref 3.6–4.8)
ALK PHOS: 88 IU/L (ref 39–117)
ALT: 13 IU/L (ref 0–32)
AST: 15 IU/L (ref 0–40)
BILIRUBIN TOTAL: 0.4 mg/dL (ref 0.0–1.2)
BUN / CREAT RATIO: 12 (ref 12–28)
BUN: 12 mg/dL (ref 8–27)
CHLORIDE: 101 mmol/L (ref 96–106)
CO2: 27 mmol/L (ref 20–29)
Calcium: 9.7 mg/dL (ref 8.7–10.3)
Creatinine, Ser: 1.02 mg/dL — ABNORMAL HIGH (ref 0.57–1.00)
GFR calc Af Amer: 67 mL/min/{1.73_m2} (ref >59)
GFR calc non Af Amer: 58 mL/min/{1.73_m2} — ABNORMAL LOW (ref >59)
GLUCOSE: 92 mg/dL (ref 65–99)
Globulin, Total: 3.1 g/dL (ref 1.5–4.5)
POTASSIUM: 4.3 mmol/L (ref 3.5–5.2)
SODIUM: 143 mmol/L (ref 134–144)
TOTAL PROTEIN: 7.5 g/dL (ref 6.0–8.5)

## 2017-12-28 LAB — CBC
Hematocrit: 42.5 % (ref 34.0–46.6)
Hemoglobin: 14.1 g/dL (ref 11.1–15.9)
MCH: 29.7 pg (ref 26.6–33.0)
MCHC: 33.2 g/dL (ref 31.5–35.7)
MCV: 90 fL (ref 79–97)
Platelets: 274 10*3/uL (ref 150–379)
RBC: 4.75 x10E6/uL (ref 3.77–5.28)
RDW: 13.5 % (ref 12.3–15.4)
WBC: 7.3 10*3/uL (ref 3.4–10.8)

## 2017-12-28 LAB — LIPID PANEL
Chol/HDL Ratio: 3.3 ratio (ref 0.0–4.4)
Cholesterol, Total: 143 mg/dL (ref 100–199)
HDL: 43 mg/dL (ref >39)
LDL Calculated: 62 mg/dL (ref 0–99)
Triglycerides: 191 mg/dL — ABNORMAL HIGH (ref 0–149)
VLDL Cholesterol Cal: 38 mg/dL (ref 5–40)

## 2017-12-28 LAB — CK: CK TOTAL: 90 U/L (ref 24–173)

## 2017-12-29 ENCOUNTER — Encounter: Payer: Self-pay | Admitting: Family Medicine

## 2017-12-29 LAB — URINE CULTURE

## 2017-12-29 NOTE — Progress Notes (Signed)
   Subjective:    Patient ID: Andrea Robles, female    DOB: December 17, 1950, 67 y.o.   MRN: [id number]  HPI Concern for a kidney infection.  Patient has felt bad for a week with right sided mid/low back pain.  Feels she may have had a low grade fever.  Perhaps some frequency and dysuria.  Also some nausea.  Generally feels not right.  Of note, she was seen by Dr. Erin Hearing in early Jan and Rt flank pain was part of her sx complex.  Monagamous and no vag DC.   S/P choleccystectomy.  No wt loss.  No cough or chest pain.  Normal BM.  No bleeding.    Review of Systems     Objective:   Physical Exam Lungs clear. Cardiac RRR without m or g Mild right CVA tenderness.  Even milder RUQ tenderness..  No otherwise normal abd.          Assessment & Plan:

## 2017-12-29 NOTE — Assessment & Plan Note (Signed)
Suspect issue is her known facet arthritis and DDD

## 2017-12-29 NOTE — Assessment & Plan Note (Signed)
Urine was clear - no hematuria so doubt nephrolithiasis.  Cultured to be sure and culture was negative. Normal CBC and CMP and wet prep are reassuring.   Plan is to observe for now.  Further testing is persists or worsens.

## 2017-12-29 NOTE — Assessment & Plan Note (Signed)
Due for lipid panel

## 2018-03-13 ENCOUNTER — Other Ambulatory Visit: Payer: Self-pay | Admitting: Family Medicine

## 2018-04-08 ENCOUNTER — Other Ambulatory Visit: Payer: Self-pay | Admitting: Family Medicine

## 2018-04-10 ENCOUNTER — Telehealth: Payer: Self-pay

## 2018-04-10 DIAGNOSIS — I10 Essential (primary) hypertension: Secondary | ICD-10-CM

## 2018-04-10 MED ORDER — LISINOPRIL-HYDROCHLOROTHIAZIDE 10-12.5 MG PO TABS
1.0000 | ORAL_TABLET | Freq: Every day | ORAL | 3 refills | Status: DC
Start: 2018-04-10 — End: 2019-01-25

## 2018-04-10 NOTE — Telephone Encounter (Signed)
Fax from pharmacy that Enalapril-HCTZ 5-12.5 is currently on backorder and they cannot get. Can medication be changed?  Danley Danker, RN [facility] RN)

## 2018-07-18 DIAGNOSIS — R69 Illness, unspecified: Secondary | ICD-10-CM | POA: Diagnosis not present

## 2018-07-19 DIAGNOSIS — M25532 Pain in left wrist: Secondary | ICD-10-CM | POA: Diagnosis not present

## 2018-07-19 DIAGNOSIS — M778 Other enthesopathies, not elsewhere classified: Secondary | ICD-10-CM | POA: Diagnosis not present

## 2018-07-24 ENCOUNTER — Ambulatory Visit: Payer: Medicare HMO | Admitting: Family Medicine

## 2018-10-03 ENCOUNTER — Encounter: Payer: Self-pay | Admitting: Family Medicine

## 2018-10-03 ENCOUNTER — Other Ambulatory Visit: Payer: Self-pay

## 2018-10-03 ENCOUNTER — Ambulatory Visit (INDEPENDENT_AMBULATORY_CARE_PROVIDER_SITE_OTHER): Payer: Medicare HMO | Admitting: Family Medicine

## 2018-10-03 VITALS — BP 124/64 | HR 54 | Temp 98.1°F | Ht 69.0 in | Wt 204.8 lb

## 2018-10-03 DIAGNOSIS — Z0000 Encounter for general adult medical examination without abnormal findings: Secondary | ICD-10-CM | POA: Diagnosis not present

## 2018-10-03 DIAGNOSIS — Z23 Encounter for immunization: Secondary | ICD-10-CM | POA: Diagnosis not present

## 2018-10-03 NOTE — Patient Instructions (Addendum)
You are done with pneumonia vaccines after today. Of course, get a flu shot every year. Tetanus is every 10 years. Due again 2024 There is a new and more effective shingle vaccine called Shingrix.  It also has more side effects.   You will be due for a mammogram in July 2020 You will be due for a colonoscopy March 2021 You will be due for another cholesterol panel after 12/28/2018 OK to switch from ecotrin 325 to enteric coated 81 mg aspirin daily.   You really are in great shape.  The only thing you could do better is to lose some additional weight.

## 2018-10-04 ENCOUNTER — Encounter: Payer: Self-pay | Admitting: Family Medicine

## 2018-10-04 MED ORDER — ASPIRIN EC 81 MG PO TBEC: 81.0000 mg | DELAYED_RELEASE_TABLET | Freq: Every day | ORAL | Status: DC

## 2018-10-04 NOTE — Progress Notes (Signed)
Established Patient Office Visit  Subjective:  Patient ID: Andrea Robles, female    DOB: 01/13/1951  Age: 67 y.o. MRN: [id number]  CC:  Chief Complaint  Patient presents with  . Annual Exam    HPI [name] presents for annual physical.  No complaints.  Only wants to make sure up to date on HPDP.  Issues 1. Immunizations.  Needs pneumovax today.  Informed about other vaccines - See AVS for due dates.  She had old zostavax and is a candidate for Shingrix.  She is a bit concerned about side effects.  She will research. 2. Cancer screen.  Wants q2y mammo, due 04/2019.  Up to date on colonoscopy.  S/P hysterectomy and above 65 so no more paps.   3. High cholesterol, primary prevention.  Last lipid panel <1 year ago.  Will check next visit.  Having trouble finding ecotrin 325.  OK to switch to enteric coated 81 mg asa. 4. Diet, eating healthy - husband has had CVA.  She has been unable to lose as much wt as she would likel 5. Exercise, walks daily.  Past Medical History:  Diagnosis Date  . Hypercholesteremia   . Hypertension     Past Surgical History:  Procedure Laterality Date  . ABDOMINAL HYSTERECTOMY    . CHOLECYSTECTOMY      Family History  Problem Relation Age of Onset  . Colon cancer Sister 16  . Lung cancer Sister   . Colon cancer Brother 56       METS  . Esophageal cancer Neg Hx   . Rectal cancer Neg Hx   . Stomach cancer Neg Hx     Social History   Socioeconomic History  . Marital status: Married    Spouse name: Not on file  . Number of children: Not on file  . Years of education: Not on file  . Highest education level: Not on file  Occupational History  . Not on file  Social Needs  . Financial resource strain: Not on file  . Food insecurity:    Worry: Not on file    Inability: Not on file  . Transportation needs:    Medical: Not on file    Non-medical: Not on file  Tobacco Use  . Smoking status: Former Smoker    Packs/day: 0.50    Years: 44.00     Pack years: 22.00    Types: Cigarettes    Last attempt to quit: 03/27/2012    Years since quitting: 6.5  . Smokeless tobacco: Never Used  Substance and Sexual Activity  . Alcohol use: Yes    Comment: ocassionally  . Drug use: No  . Sexual activity: Never  Lifestyle  . Physical activity:    Days per week: Not on file    Minutes per session: Not on file  . Stress: Not on file  Relationships  . Social connections:    Talks on phone: Not on file    Gets together: Not on file    Attends religious service: Not on file    Active member of club or organization: Not on file    Attends meetings of clubs or organizations: Not on file    Relationship status: Not on file  . Intimate partner violence:    Fear of current or ex partner: Not on file    Emotionally abused: Not on file    Physically abused: Not on file    Forced sexual activity: Not on file  Other Topics Concern  . Not on file  Social History Narrative  . Not on file    Outpatient Medications Prior to Visit  Medication Sig Dispense Refill  . aspirin 325 MG EC tablet Take 325 mg by mouth daily.    Marland Kitchen atorvastatin (LIPITOR) 10 MG tablet TAKE ONE TABLET BY MOUTH ONCE DAILY 90 tablet 3  . lisinopril-hydrochlorothiazide (PRINZIDE,ZESTORETIC) 10-12.5 MG tablet Take 1 tablet by mouth daily. 90 tablet 3  . LORazepam (ATIVAN) 0.5 MG tablet TAKE 1 TABLET BY MOUTH TWICE DAILY AS NEEDED FOR ANXIETY 30 tablet 5  . metoprolol tartrate (LOPRESSOR) 25 MG tablet TAKE 1 TABLET BY MOUTH TWICE DAILY 180 tablet 3  . traMADol (ULTRAM) 50 MG tablet Take 1 tablet (50 mg total) by mouth every 8 (eight) hours as needed. 30 tablet 1   No facility-administered medications prior to visit.     Allergies  Allergen Reactions  . Crestor [Rosuvastatin Calcium]   . Flexeril [Cyclobenzaprine]     Makes too sleepy  . Nortriptyline     Too sedating  . Pravastatin     Leg discomfort (cramps and swelling)  Improved off statin  . Simvastatin      abnormal  . Famotidine Other (See Comments)     Rash and itching while on multiple meds for H pylori  . Metronidazole Other (See Comments)     Rash and itching while on mult meds for H pylori    ROS Review of Systems  Denies HA, CP, DOE, Abd pain, skin lesions, focal weakness, focal numbness, bleeding..  Denies change in appetite, wt, bowel or bladder.   Objective:    Physical Exam  BP 124/64   Pulse (!) 54   Temp 98.1 F (36.7 C) (Oral)   Ht 5\' 9"  (1.753 m)   Wt 204 lb 12.8 oz (92.9 kg)   SpO2 99%   BMI 30.24 kg/m  Wt Readings from Last 3 Encounters:  10/03/18 204 lb 12.8 oz (92.9 kg)  12/27/17 208 lb 12.8 oz (94.7 kg)  10/25/17 208 lb (94.3 kg)  HEENT WNL Neck supple Lungs clear Cardiac RRR without m or g Abd benign Ext no edema Neuro.  Grossly normal strength, sensation, gait, mentation and affect.   There are no preventive care reminders to display for this patient.  There are no preventive care reminders to display for this patient.  Lab Results  Component Value Date   TSH 1.07 11/03/2016   Lab Results  Component Value Date   WBC 7.3 12/27/2017   HGB 14.1 12/27/2017   HCT 42.5 12/27/2017   MCV 90 12/27/2017   PLT 274 12/27/2017   Lab Results  Component Value Date   NA 143 12/27/2017   K 4.3 12/27/2017   CO2 27 12/27/2017   GLUCOSE 92 12/27/2017   BUN 12 12/27/2017   CREATININE 1.02 (H) 12/27/2017   BILITOT 0.4 12/27/2017   ALKPHOS 88 12/27/2017   AST 15 12/27/2017   ALT 13 12/27/2017   PROT 7.5 12/27/2017   ALBUMIN 4.4 12/27/2017   CALCIUM 9.7 12/27/2017   ANIONGAP 14 02/04/2015   Lab Results  Component Value Date   CHOL 143 12/27/2017   Lab Results  Component Value Date   HDL 43 12/27/2017   Lab Results  Component Value Date   LDLCALC 62 12/27/2017   Lab Results  Component Value Date   TRIG 191 (H) 12/27/2017   Lab Results  Component Value Date   CHOLHDL 3.3 12/27/2017  Lab Results  Component Value Date   HGBA1C 5.7  11/03/2016      Assessment & Plan:   Problem List Items Addressed This Visit    None    Visit Diagnoses    Need for 23-polyvalent pneumococcal polysaccharide vaccine    -  Primary   Relevant Orders   Pneumococcal polysaccharide vaccine 23-valent greater than or equal to 2yo subcutaneous/IM (Completed)      No orders of the defined types were placed in this encounter.   Follow-up: No follow-ups on file.    Zenia Resides, MD

## 2018-10-04 NOTE — Assessment & Plan Note (Signed)
Healthly, borderline obese female with no bad habits.

## 2018-10-10 ENCOUNTER — Other Ambulatory Visit: Payer: Self-pay | Admitting: Family Medicine

## 2018-10-10 DIAGNOSIS — E7800 Pure hypercholesterolemia, unspecified: Secondary | ICD-10-CM

## 2018-11-18 ENCOUNTER — Other Ambulatory Visit: Payer: Self-pay | Admitting: Family Medicine

## 2019-01-25 ENCOUNTER — Other Ambulatory Visit: Payer: Self-pay | Admitting: Family Medicine

## 2019-01-25 DIAGNOSIS — I10 Essential (primary) hypertension: Secondary | ICD-10-CM

## 2019-05-15 ENCOUNTER — Other Ambulatory Visit: Payer: Self-pay | Admitting: Family Medicine

## 2019-05-15 DIAGNOSIS — Z1231 Encounter for screening mammogram for malignant neoplasm of breast: Secondary | ICD-10-CM

## 2019-07-02 ENCOUNTER — Other Ambulatory Visit: Payer: Self-pay

## 2019-07-02 ENCOUNTER — Ambulatory Visit
Admission: RE | Admit: 2019-07-02 | Discharge: 2019-07-02 | Disposition: A | Discharge status: Home / Self Care | Payer: Medicare HMO | Source: Ambulatory Visit | Attending: Family Medicine | Admitting: Family Medicine

## 2019-07-02 DIAGNOSIS — Z1231 Encounter for screening mammogram for malignant neoplasm of breast: Secondary | ICD-10-CM

## 2019-07-17 ENCOUNTER — Ambulatory Visit (INDEPENDENT_AMBULATORY_CARE_PROVIDER_SITE_OTHER): Payer: Medicare HMO | Admitting: Family Medicine

## 2019-07-17 ENCOUNTER — Encounter: Payer: Self-pay | Admitting: Family Medicine

## 2019-07-17 ENCOUNTER — Other Ambulatory Visit: Payer: Self-pay

## 2019-07-17 DIAGNOSIS — M25551 Pain in right hip: Secondary | ICD-10-CM | POA: Diagnosis not present

## 2019-07-17 DIAGNOSIS — Z0000 Encounter for general adult medical examination without abnormal findings: Secondary | ICD-10-CM

## 2019-07-17 DIAGNOSIS — I10 Essential (primary) hypertension: Secondary | ICD-10-CM

## 2019-07-17 DIAGNOSIS — Z23 Encounter for immunization: Secondary | ICD-10-CM | POA: Diagnosis not present

## 2019-07-17 DIAGNOSIS — R351 Nocturia: Secondary | ICD-10-CM | POA: Diagnosis not present

## 2019-07-17 MED ORDER — OXYBUTYNIN CHLORIDE 5 MG PO TABS: 5.0000 mg | ORAL_TABLET | Freq: Every day | ORAL | 3 refills | Status: DC

## 2019-07-17 NOTE — Patient Instructions (Addendum)
I will call with x ray results. Try switching from benadryl to melatonin for sleep. Stop the metoprolol.  Tell me if your blood pressure goes up or if you feel heart fluttering. I will call with lab results.   You will be due for your 5 year colonoscopy next spring 12/2019.

## 2019-07-18 ENCOUNTER — Encounter: Payer: Self-pay | Admitting: Family Medicine

## 2019-07-18 ENCOUNTER — Ambulatory Visit
Admission: RE | Admit: 2019-07-18 | Discharge: 2019-07-18 | Disposition: A | Discharge status: Home / Self Care | Payer: Medicare HMO | Source: Ambulatory Visit | Attending: Family Medicine | Admitting: Family Medicine

## 2019-07-18 DIAGNOSIS — M1611 Unilateral primary osteoarthritis, right hip: Secondary | ICD-10-CM | POA: Diagnosis not present

## 2019-07-18 LAB — LIPID PANEL
Chol/HDL Ratio: 3.7 ratio (ref 0.0–4.4)
Cholesterol, Total: 159 mg/dL (ref 100–199)
HDL: 43 mg/dL (ref >39)
LDL Chol Calc (NIH): 83 mg/dL (ref 0–99)
Triglycerides: 192 mg/dL — ABNORMAL HIGH (ref 0–149)
VLDL Cholesterol Cal: 33 mg/dL (ref 5–40)

## 2019-07-18 LAB — CMP14+EGFR
ALT: 13 IU/L (ref 0–32)
AST: 21 IU/L (ref 0–40)
Albumin/Globulin Ratio: 1.5 (ref 1.2–2.2)
Albumin: 4.3 g/dL (ref 3.8–4.8)
Alkaline Phosphatase: 95 IU/L (ref 39–117)
BUN/Creatinine Ratio: 15 (ref 12–28)
BUN: 17 mg/dL (ref 8–27)
Bilirubin Total: 0.4 mg/dL (ref 0.0–1.2)
CO2: 26 mmol/L (ref 20–29)
Calcium: 9.5 mg/dL (ref 8.7–10.3)
Chloride: 101 mmol/L (ref 96–106)
Creatinine, Ser: 1.14 mg/dL — ABNORMAL HIGH (ref 0.57–1.00)
GFR calc Af Amer: 57 mL/min/{1.73_m2} — ABNORMAL LOW (ref >59)
GFR calc non Af Amer: 50 mL/min/{1.73_m2} — ABNORMAL LOW (ref >59)
Globulin, Total: 2.9 g/dL (ref 1.5–4.5)
Glucose: 110 mg/dL — ABNORMAL HIGH (ref 65–99)
Potassium: 4.2 mmol/L (ref 3.5–5.2)
Sodium: 140 mmol/L (ref 134–144)
Total Protein: 7.2 g/dL (ref 6.0–8.5)

## 2019-07-18 LAB — CBC
Hematocrit: 41.9 % (ref 34.0–46.6)
Hemoglobin: 14.1 g/dL (ref 11.1–15.9)
MCH: 29.9 pg (ref 26.6–33.0)
MCHC: 33.7 g/dL (ref 31.5–35.7)
MCV: 89 fL (ref 79–97)
Platelets: 251 10*3/uL (ref 150–450)
RBC: 4.72 x10E6/uL (ref 3.77–5.28)
RDW: 12.7 % (ref 11.7–15.4)
WBC: 7.9 10*3/uL (ref 3.4–10.8)

## 2019-07-18 LAB — TSH: TSH: 0.823 u[IU]/mL (ref 0.450–4.500)

## 2019-07-18 NOTE — Assessment & Plan Note (Signed)
Really sounds like hip arthritis.  Will recheck xrays.

## 2019-07-18 NOTE — Assessment & Plan Note (Signed)
Insurance will not pay for [employer].  Rx with oxybutinin, low dose at night only.

## 2019-07-18 NOTE — Progress Notes (Signed)
Established Patient Office Visit  Subjective:  Patient ID: Andrea Robles, female    DOB: 1951/09/22  Age: 68 y.o. MRN: [id number]  CC:  Chief Complaint  Patient presents with  . Annual Exam    HPI Andrea Robles presents for annual exam and a few minor problems. 1. Continued right hip pain  Known back pain.  This seems to be hip.  No weakness or numbness.  Hip will catch on her.  No falls.  Previous x rays have not demonstrated overt arthritis.  Note Had some heart flutterings when initially put on beta blocker.  None recent.  Never had documented A fib. 2. Having some lightheadedness.  I note her lowish BP.  States worse on standing.  On three antihypertensives.  Pulse also lowish.  No vertigo, syncope or falls. 3. Insomnia.  Takes OTC benadryl each night.  Educated about link between benadryl (anticholinergics) and dementia. 4. Does have nocturia x 4 which she finds quite bothersome.  Diet and exercise, generally healthy. Up to date on HPDP.  No tobacco, etoh, drugs or at risk behavior.    Past Medical History:  Diagnosis Date  . Hypercholesteremia   . Hypertension     Past Surgical History:  Procedure Laterality Date  . ABDOMINAL HYSTERECTOMY    . CHOLECYSTECTOMY      Family History  Problem Relation Age of Onset  . Colon cancer Sister 66  . Lung cancer Sister   . Colon cancer Brother 56       METS  . Esophageal cancer Neg Hx   . Rectal cancer Neg Hx   . Stomach cancer Neg Hx     Social History   Socioeconomic History  . Marital status: Married    Spouse name: Not on file  . Number of children: Not on file  . Years of education: Not on file  . Highest education level: Not on file  Occupational History  . Not on file  Social Needs  . Financial resource strain: Not on file  . Food insecurity    Worry: Not on file    Inability: Not on file  . Transportation needs    Medical: Not on file    Non-medical: Not on file  Tobacco Use  . Smoking status:  Former Smoker    Packs/day: 0.50    Years: 44.00    Pack years: 22.00    Types: Cigarettes    Quit date: 03/27/2012    Years since quitting: 7.3  . Smokeless tobacco: Never Used  Substance and Sexual Activity  . Alcohol use: Yes    Comment: ocassionally  . Drug use: No  . Sexual activity: Never  Lifestyle  . Physical activity    Days per week: Not on file    Minutes per session: Not on file  . Stress: Not on file  Relationships  . Social Herbalist on phone: Not on file    Gets together: Not on file    Attends religious service: Not on file    Active member of club or organization: Not on file    Attends meetings of clubs or organizations: Not on file    Relationship status: Not on file  . Intimate partner violence    Fear of current or ex partner: Not on file    Emotionally abused: Not on file    Physically abused: Not on file    Forced sexual activity: Not on file  Other Topics  Concern  . Not on file  Social History Narrative  . Not on file    Outpatient Medications Prior to Visit  Medication Sig Dispense Refill  . aspirin EC 81 MG tablet Take 1 tablet (81 mg total) by mouth daily.    Marland Kitchen atorvastatin (LIPITOR) 10 MG tablet TAKE 1 TABLET BY MOUTH ONCE DAILY 90 tablet 3  . lisinopril-hydrochlorothiazide (PRINZIDE,ZESTORETIC) 10-12.5 MG tablet Take 1 tablet by mouth once daily 90 tablet 3  . LORazepam (ATIVAN) 0.5 MG tablet TAKE 1 TABLET BY MOUTH TWICE DAILY AS NEEDED FOR ANXIETY 30 tablet 5  . metoprolol tartrate (LOPRESSOR) 25 MG tablet TAKE 1 TABLET BY MOUTH TWICE DAILY 180 tablet 3  . traMADol (ULTRAM) 50 MG tablet Take 1 tablet (50 mg total) by mouth every 8 (eight) hours as needed. 30 tablet 1   No facility-administered medications prior to visit.     Allergies  Allergen Reactions  . Crestor [Rosuvastatin Calcium]   . Flexeril [Cyclobenzaprine]     Makes too sleepy  . Nortriptyline     Too sedating  . Pravastatin     Leg discomfort (cramps and  swelling)  Improved off statin  . Simvastatin     abnormal  . Famotidine Other (See Comments)     Rash and itching while on multiple meds for H pylori  . Metronidazole Other (See Comments)     Rash and itching while on mult meds for H pylori    ROS Review of Systems   No CP, SOB, headaches abd pain or worrisome skin lesions.  Denies change in vision, appetite, bowels or bladder.  No bleeding. Objective:    Physical Exam  BP 108/64   Pulse 66   Wt 205 lb 3.2 oz (93.1 kg)   SpO2 98%   BMI 30.30 kg/m  Wt Readings from Last 3 Encounters:  07/17/19 205 lb 3.2 oz (93.1 kg)  10/03/18 204 lb 12.8 oz (92.9 kg)  12/27/17 208 lb 12.8 oz (94.7 kg)   HEENT WNL, Neck supple without masses Lungs clear Cardiac RRR without m or g Abd benign Ext no edema  Right hip pain on internal and external rotation. Neuro, motor, sensory, gate and cognition grossly normal.  There are no preventive care reminders to display for this patient.  There are no preventive care reminders to display for this patient.  Lab Results  Component Value Date   TSH 0.823 07/17/2019   Lab Results  Component Value Date   WBC 7.9 07/17/2019   HGB 14.1 07/17/2019   HCT 41.9 07/17/2019   MCV 89 07/17/2019   PLT 251 07/17/2019   Lab Results  Component Value Date   NA 140 07/17/2019   K 4.2 07/17/2019   CO2 26 07/17/2019   GLUCOSE 110 (H) 07/17/2019   BUN 17 07/17/2019   CREATININE 1.14 (H) 07/17/2019   BILITOT 0.4 07/17/2019   ALKPHOS 95 07/17/2019   AST 21 07/17/2019   ALT 13 07/17/2019   PROT 7.2 07/17/2019   ALBUMIN 4.3 07/17/2019   CALCIUM 9.5 07/17/2019   ANIONGAP 14 02/04/2015   Lab Results  Component Value Date   CHOL 159 07/17/2019   Lab Results  Component Value Date   HDL 43 07/17/2019   Lab Results  Component Value Date   LDLCALC 62 12/27/2017   Lab Results  Component Value Date   TRIG 192 (H) 07/17/2019   Lab Results  Component Value Date   CHOLHDL 3.7 07/17/2019   Lab  Results  Component Value Date   HGBA1C 5.7 11/03/2016      Assessment & Plan:   Problem List Items Addressed This Visit    HYPERTENSION, BENIGN SYSTEMIC   Relevant Orders   CBC (Completed)   CMP14+EGFR (Completed)   TSH (Completed)   Lipid panel (Completed)   Nocturia   Relevant Medications   oxybutynin (DITROPAN) 5 MG tablet   Right hip pain   Relevant Orders   DG HIP UNILAT WITH PELVIS 2-3 VIEWS RIGHT    Other Visit Diagnoses    Need for immunization against influenza       Relevant Orders   Flu Vaccine QUAD 36+ mos IM (Completed)      Meds ordered this encounter  Medications  . oxybutynin (DITROPAN) 5 MG tablet    Sig: Take 1 tablet (5 mg total) by mouth at bedtime.    Dispense:  30 tablet    Refill:  3    Follow-up: No follow-ups on file.    Zenia Resides, MD

## 2019-07-18 NOTE — Assessment & Plan Note (Signed)
Healthly female with good lifestyle and no at risk behaviors.

## 2019-07-18 NOTE — Assessment & Plan Note (Signed)
Perhaps over treated causing some lightheadedness.  Will try off Beta blocker.

## 2019-07-19 ENCOUNTER — Other Ambulatory Visit: Payer: Self-pay | Admitting: Family Medicine

## 2019-07-19 DIAGNOSIS — M1611 Unilateral primary osteoarthritis, right hip: Secondary | ICD-10-CM

## 2019-07-19 DIAGNOSIS — I10 Essential (primary) hypertension: Secondary | ICD-10-CM

## 2019-07-19 MED ORDER — METOPROLOL TARTRATE 25 MG PO TABS: 25.0000 mg | ORAL_TABLET | Freq: Two times a day (BID) | ORAL | Status: DC

## 2019-07-19 NOTE — Assessment & Plan Note (Signed)
Xrays confirm the suspected osteoarthritis.

## 2019-07-19 NOTE — Assessment & Plan Note (Signed)
Called with Xray results.  She is back on metoprolol because BP went up off.

## 2019-08-06 ENCOUNTER — Other Ambulatory Visit: Payer: Self-pay | Admitting: Family Medicine

## 2019-10-06 ENCOUNTER — Other Ambulatory Visit: Payer: Self-pay | Admitting: Family Medicine

## 2019-10-06 DIAGNOSIS — E7800 Pure hypercholesterolemia, unspecified: Secondary | ICD-10-CM

## 2019-11-03 ENCOUNTER — Other Ambulatory Visit: Payer: Self-pay | Admitting: Family Medicine

## 2019-11-03 DIAGNOSIS — I10 Essential (primary) hypertension: Secondary | ICD-10-CM

## 2019-11-11 ENCOUNTER — Encounter: Payer: Self-pay | Admitting: Gastroenterology

## 2020-01-01 ENCOUNTER — Encounter: Payer: Self-pay | Admitting: Gastroenterology

## 2020-01-01 ENCOUNTER — Ambulatory Visit (INDEPENDENT_AMBULATORY_CARE_PROVIDER_SITE_OTHER): Payer: Medicare HMO | Admitting: Family Medicine

## 2020-01-01 ENCOUNTER — Encounter: Payer: Self-pay | Admitting: Family Medicine

## 2020-01-01 ENCOUNTER — Other Ambulatory Visit: Payer: Self-pay

## 2020-01-01 VITALS — BP 132/76 | HR 63 | Wt 211.4 lb

## 2020-01-01 DIAGNOSIS — I10 Essential (primary) hypertension: Secondary | ICD-10-CM | POA: Diagnosis not present

## 2020-01-01 DIAGNOSIS — Z86010 Personal history of colonic polyps: Secondary | ICD-10-CM | POA: Diagnosis not present

## 2020-01-01 DIAGNOSIS — Z122 Encounter for screening for malignant neoplasm of respiratory organs: Secondary | ICD-10-CM | POA: Diagnosis not present

## 2020-01-01 DIAGNOSIS — L570 Actinic keratosis: Secondary | ICD-10-CM

## 2020-01-01 DIAGNOSIS — N1831 Chronic kidney disease, stage 3a: Secondary | ICD-10-CM | POA: Diagnosis not present

## 2020-01-01 DIAGNOSIS — R7303 Prediabetes: Secondary | ICD-10-CM | POA: Diagnosis not present

## 2020-01-01 DIAGNOSIS — R351 Nocturia: Secondary | ICD-10-CM

## 2020-01-01 DIAGNOSIS — N183 Chronic kidney disease, stage 3 unspecified: Secondary | ICD-10-CM | POA: Insufficient documentation

## 2020-01-01 DIAGNOSIS — Z0000 Encounter for general adult medical examination without abnormal findings: Secondary | ICD-10-CM

## 2020-01-01 LAB — POCT URINALYSIS DIP (MANUAL ENTRY)
Bilirubin, UA: NEGATIVE
Blood, UA: NEGATIVE
Glucose, UA: NEGATIVE mg/dL
Ketones, POC UA: NEGATIVE mg/dL
Leukocytes, UA: NEGATIVE
Nitrite, UA: NEGATIVE
Protein Ur, POC: NEGATIVE mg/dL
Spec Grav, UA: 1.02 (ref 1.010–1.025)
Urobilinogen, UA: 0.2 E.U./dL
pH, UA: 7 (ref 5.0–8.0)

## 2020-01-01 LAB — POCT GLYCOSYLATED HEMOGLOBIN (HGB A1C): HbA1c, POC (controlled diabetic range): 6 % (ref 0.0–7.0)

## 2020-01-01 NOTE — Assessment & Plan Note (Signed)
Froze three places on back.

## 2020-01-01 NOTE — Assessment & Plan Note (Signed)
Due for surveilence colonoscopy.  Referral put in.

## 2020-01-01 NOTE — Assessment & Plan Note (Signed)
Check A1C 

## 2020-01-01 NOTE — Assessment & Plan Note (Signed)
Accepts lung cancer screening.

## 2020-01-01 NOTE — Patient Instructions (Addendum)
1. I put in a GI referral for your colonoscopy.  Someone should call 2. As we discussed, with the new guidelines, you are a candidate for CT scan screening for lung cancer.  Someone should call to set this up. 3. You are officially prediabetic.  Work hard on diet and exercise so that you never end up diabetic. 4. You urine is clear.  I likely can prescribe medicine to have less peeing at night.  Before I do that, I want to see your blood pressure 24 hour readings.   5. Although your blood pressure is perfect in the office, I am worried that I am over treating you given your symptoms of lightheadedness.  On the way out, make an appointment with Dr. Valentina Lucks for 24 hour blood pressure monitoring.

## 2020-01-01 NOTE — Assessment & Plan Note (Signed)
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 Await 24 hour BP reading.  Do not want to worsen orthostasis with treatment.

## 2020-01-01 NOTE — Progress Notes (Signed)
    SUBJECTIVE:   CHIEF COMPLAINT / HPI:   Multiple issues: 1. Nocturia,  Oxybutynin 5 did not help.  No fever.  Does have some daytime urgency. 2. CKD 3a  GFR = 50.  Wanted more explaination.  Told need good control of BP and monitor for developing diabetes.  Last A1c was 2 years ago. 3. HBP.  Numbers today are great.  However she gives a good hx of nightime orthostatic sx - dizziness on standing, especially at night.  Denies CP or DOE.   4. Concern for skin lesions on back.  I have previously frozen some.   5. Smoked 30 years.  Quit 2013.  She is a candidate for lung cancer screening.     OBJECTIVE:   BP 132/76   Pulse 63   Wt 211 lb 6.4 oz (95.9 kg)   SpO2 99%   BMI 31.22 kg/m   LUngs clear Cardiac RRR without m or g Skin, three AKs on back  All frozen.  ASSESSMENT/PLAN:   Actinic keratosis Froze three places on back.  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 Await 24 hour BP reading.  Do not want to worsen orthostasis with treatment.   Prediabetes Check A1C  HYPERTENSION, BENIGN SYSTEMIC Numbers great today but concerned about over treatment.  Check 24 hour BP.  Encounter for screening for lung cancer Accepts lung cancer screening.  Hx of adenomatous polyp of colon Due for surveilence colonoscopy.  Referral put in.  CKD (chronic kidney disease) stage 3, GFR 30-59 ml/min Educated about the importance of controling risk factors.     Zenia Resides, MD [place]

## 2020-01-01 NOTE — Assessment & Plan Note (Signed)
Numbers great today but concerned about over treatment.  Check 24 hour BP.

## 2020-01-01 NOTE — Assessment & Plan Note (Signed)
Educated about the importance of controling risk factors.

## 2020-01-06 ENCOUNTER — Other Ambulatory Visit: Payer: Self-pay

## 2020-01-06 ENCOUNTER — Ambulatory Visit (INDEPENDENT_AMBULATORY_CARE_PROVIDER_SITE_OTHER): Payer: Medicare HMO | Admitting: Pharmacist

## 2020-01-06 ENCOUNTER — Encounter: Payer: Self-pay | Admitting: Pharmacist

## 2020-01-06 DIAGNOSIS — I10 Essential (primary) hypertension: Secondary | ICD-10-CM

## 2020-01-06 NOTE — Progress Notes (Signed)
S:    Patient arrives in good spirits, ambulating without assistance, accompanied by her husband.  Presents to the clinic for ambulatory blood pressure evaluation.   Patient was referred and last seen by Primary Care Provider, Dr. Andria Frames,  on 01/01/2020.   Patient reports orthostatic "dizziness" which has been problematic for quit some time.  She has reduced metoprolol from 50mg  BID to 25mg  BID however stopping metoprolol caused significant BP elevations per patient.    Diagnosed with Hypertension in the year of 2008.    Medication compliance is reported to be good.  Discussed procedure for wearing the monitor and gave patient written instructions. Monitor was placed on non-dominant arm with instructions to return in the morning.   Current BP Medications include:   Lisinopril 10/12.5mg , metoprolol 25mg  bid.   Antihypertensives tried in the past include:   Dietary habits include:  O:   Physical Exam   ROS  Last 3 Office BP readings: BP Readings from Last 3 Encounters:  01/06/20 (!) 141/85  01/01/20 132/76  07/17/19 108/64     Clinical Atherosclerotic Cardiovascular Disease (ASCVD): No  The 10-year ASCVD risk score Mikey Bussing DC Jr., et al., 2013) is: 10.9%   Values used to calculate the score:     Age: 69 years     Sex: Female     Is Non-Hispanic African American: No     Diabetic: No     Tobacco smoker: No     Systolic Blood Pressure: [id number] mmHg     Is BP treated: Yes     HDL Cholesterol: 43 mg/dL     Total Cholesterol: 159 mg/dL  Basic Metabolic Panel    Component Value Date/Time   NA 140 07/17/2019 1647   K 4.2 07/17/2019 1647   CL 101 07/17/2019 1647   CO2 26 07/17/2019 1647   GLUCOSE 110 (H) 07/17/2019 1647   GLUCOSE 116 (H) 07/13/2016 1119   BUN 17 07/17/2019 1647   CREATININE 1.14 (H) 07/17/2019 1647   CREATININE 1.06 (H) 07/13/2016 1119   CALCIUM 9.5 07/17/2019 1647   GFRNONAA 50 (L) 07/17/2019 1647   GFRNONAA 56 (L) 07/13/2016 1119   GFRAA 57 (L)  07/17/2019 1647   GFRAA 64 07/13/2016 1119    Renal function: CrCl cannot be calculated (Patient's most recent lab result is older than the maximum 21 days allowed.).   ABPM Study Data: Arm Placement left arm   Overall Mean 24hr BP:   128/64 mm Hg HR: 62   Daytime Mean BP:  138/68 mm Hg HR: 64   Nighttime Mean BP:  108/55 mm Hg HR: 58   Dipping Pattern: Yes.    Sys:   22.1%   Dia: 19.1%   [normal dipping ~10-20%]  Non-hypertensive ABPM thresholds: daytime BP <125/75 mmHg, sleeptime BP <120/70 mmHg    A/P: History of hypertension since 2008 found to have isolated systolic hypertension given 24-hour ambulatory blood pressure demonstrates a wide pulse pressure with an average blood pressure of 128/64 mm Hg, and a nocturnal dipping pattern that is normal.  Changes to medications include discontinuing metoprolol tartrate 25 mg taken twice daily; initiating metoprolol succinate 25 mg taken once daily in the morning.  - At follow-up in 4-6 weeks with PCP, if patient remains symptomatic with dizziness, I believe the next trial would be stopping the HCTZ.    Results reviewed and written information provided.  Total time in face-to-face counseling 20 minutes.   F/U Clinic Visit with Dr. Andria Frames.  Patient  seen with Marin Roberts, PharmD PGY-1 Resident.

## 2020-01-06 NOTE — Patient Instructions (Addendum)
Thank you for returning this morning. Great to see you again.  Please stop the metoprolol tartrate 25 mg twice daily and pickup metoprolol succinate 25 mg from your pharmacy. Start taking metoprolol succinate 25 mg daily in the morning.  Follow up with Dr. Andria Frames in 4-6 weeks.

## 2020-01-07 ENCOUNTER — Ambulatory Visit: Payer: Medicare HMO | Admitting: Pharmacist

## 2020-01-07 ENCOUNTER — Other Ambulatory Visit: Payer: Self-pay

## 2020-01-07 MED ORDER — METOPROLOL SUCCINATE ER 25 MG PO TB24: 25.0000 mg | ORAL_TABLET | Freq: Every morning | ORAL | 3 refills | Status: DC

## 2020-01-07 NOTE — Assessment & Plan Note (Signed)
History of hypertension since 2008 found to have isolated systolic hypertension given 24-hour ambulatory blood pressure demonstrates a wide pulse pressure with an average blood pressure of 128/64 mm Hg, and a nocturnal dipping pattern that is normal.  Changes to medications include discontinuing metoprolol tartrate 25 mg taken twice daily; initiating metoprolol succinate 25 mg taken once daily in the morning.  - At follow-up in 4-6 weeks with PCP, if patient remains symptomatic with dizziness, I believe the next trial would be stopping the HCTZ.

## 2020-01-07 NOTE — Progress Notes (Signed)
Noted and agree. 

## 2020-01-10 ENCOUNTER — Other Ambulatory Visit: Payer: Self-pay

## 2020-01-10 ENCOUNTER — Telehealth (INDEPENDENT_AMBULATORY_CARE_PROVIDER_SITE_OTHER): Payer: Medicare HMO | Admitting: Family Medicine

## 2020-01-10 ENCOUNTER — Telehealth: Payer: Self-pay

## 2020-01-10 DIAGNOSIS — R42 Dizziness and giddiness: Secondary | ICD-10-CM

## 2020-01-10 NOTE — Progress Notes (Deleted)
    SUBJECTIVE:   CHIEF COMPLAINT / HPI:   ***  PERTINENT  PMH / PSH: ***  OBJECTIVE:   There were no vitals taken for this visit.  ***  ASSESSMENT/PLAN:   No problem-specific Assessment & Plan notes found for this encounter.     Ximena Todaro, DO [facility]   

## 2020-01-10 NOTE — Progress Notes (Signed)
[facility] Tele[facility]icine Visit  Patient consented to have virtual visit. Method of visit: Video was attempted, but technology challenges prevented patient from using video, so visit was conducted via telephone.  Encounter participants: Patient: Andrea Robles - located at home in [facility] Provider: Nuala Alpha - located at [facility] Others (if applicable): none  Chief Complaint: Dizziness  HPI:  Patient is a 69y/o with PMH of HTN, COPD, CKD III, Prediabetes, OA of the right hip, and HLD presenting for new onset dizziness.  Patient states it started yesterday and got worse today. Blood pressure today was 153/85 as she checks regularly at home. Started new medication for her blood pressure on Wednesday. Metoprolol tartrate once day was stopped and Metoprolol succinate was started in an appointment with Dr. Valentina Lucks. She has been dizzy like this in the past but before it went away on its own relatively quickly. She does not feel like the room is spinning. No nausea or vomiting, no hearing changes or tinnitus, no headache. Does not seem to get worse with movement. Started when she got up from bed to use the bathroom at night. She states she feels off balance and doesn't want to walk to the mailbox as she is afraid of falling in her current condition. She is currently taking Lisinopril-HCTZ combo pill. Taking those as prescribed. Patient wants to stop taking the Metoprolol succinate and go back to taking Metoprolol tartrate BID as she says the previous type "never made me dizzy". I discussed multiple options with her such as stopping Metoprolol and trying a different beta-blocker, stopping all beta-blockers and increasing her Lisinopril-HCTZ combo pill and she prefers to go back to taking Metoprolol tartrate BID. She will call us Monday to see if this resolves her dizziness. If not, I encouraged her to set up an appointment with [place].   ROS: per HPI  Pertinent PMHx: HTN, COPD, CKD III, HLD,  Right Hip OA  Exam:  Respiratory: speaking regularly in complete sentences, not stopping to catch a breath  Assessment/Plan:  Dizziness Difficult to determine etiology via virtual visit but symptoms don't correlate with vertigo. No hearing changes or tinnitus so Meniere's disease unlikely. Considered labyrinthitis, vertebrobasilar insufficiency, and acoustic neuroma as all possibly causing her dizziness. Given her recent medication change from short acting to long acting Metoprolol was done just before onset of her dizziness is it a potential cause of her symptoms.  - Patient wants to go back to taking Metoprolol tartrate BID, stop Metoprolol succinate. - Follow up with Dr. Andria Frames via phone call on Monday to see if this resolves her symptoms. I let her know if it does not she should schedule an appointment to be seen and evaluated in person in our clinic.    Time spent during visit with patient: >20 minutes  Harolyn Rutherford, DO [facility], PGY-3

## 2020-01-10 NOTE — Telephone Encounter (Signed)
Patient calls nurse line regarding elevated BP since switching medications. Patient reports change in BP medication management from visit with [place] on 3/15. Patient states that she has increased dizziness. Mild dizziness constantly, increases with position changes and walking. Patient's last measured BP: 153/85. Denies headache or visual changes.   Scheduled patient virtual appointment with access to care to discuss medication changes.   To PCP and Garlan Fillers [facility] provider)  Talbot Grumbling, RN

## 2020-01-10 NOTE — Assessment & Plan Note (Signed)
Difficult to determine etiology via virtual visit but symptoms don't correlate with vertigo. No hearing changes or tinnitus so Meniere's disease unlikely. Considered labyrinthitis, vertebrobasilar insufficiency, and acoustic neuroma as all possibly causing her dizziness. Given her recent medication change from short acting to long acting Metoprolol was done just before onset of her dizziness is it a potential cause of her symptoms.  - Patient wants to go back to taking Metoprolol tartrate BID, stop Metoprolol succinate. - Follow up with Dr. Andria Frames via phone call on Monday to see if this resolves her symptoms. I let her know if it does not she should schedule an appointment to be seen and evaluated in person in our clinic.

## 2020-01-13 ENCOUNTER — Ambulatory Visit: Payer: Medicare HMO

## 2020-01-13 NOTE — Telephone Encounter (Signed)
Patient calls nurse line regarding BP medication management. Patient had telemedicine visit on Friday to discuss symptoms. Patient reports having increased dizziness and increase in BP after switching from metoprolol tartrate to metoprolol succinate.   Per telemedicine note, patient advised to switch back to original BP med (metorprolol tartrate) to see if symptoms improved. Patient reports improvement in symptoms and is feeling much better overall.   Routing to PCP and Dr. Pervis Hocking, RN

## 2020-01-14 NOTE — Telephone Encounter (Signed)
Attempted call RE blood pressure response to metoprolol succinate and change back to metoprolol tartrate over the weekend.   No answer, left message I would try again later today or tomorrow to discuss reaction and new plan.

## 2020-01-16 MED ORDER — LISINOPRIL 10 MG PO TABS: 10.0000 mg | ORAL_TABLET | Freq: Every day | ORAL | 3 refills | Status: DC

## 2020-01-16 NOTE — Addendum Note (Signed)
Addended by: Leavy Cella on: 01/16/2020 02:48 PM   Modules accepted: Orders

## 2020-01-16 NOTE — Telephone Encounter (Signed)
Patient contacted and we reviewed "intolerance" of switch from metoprolol tartrate to metoprolol succinate.   She reported that she "just did not feel right" and her blood pressure was higher.  She has returned to metoprolol tartrate 20mg  BID.    She continues to take lisinopril/HCTZ 10/12.5mg  daily.   We discussed benefits of the lisinopril for her kidneys and she agreed to do a trial of lisinopril 10mg  monotherapy with the metoprolol tartrate and then reevaluate dizziness and home BP readings.     She verbalized understanding to stop combination tablet Lisinopril/HCTZ and start lisinopril once daily in the PM.

## 2020-01-20 NOTE — Telephone Encounter (Signed)
Noted and agree. 

## 2020-01-23 ENCOUNTER — Other Ambulatory Visit: Payer: Self-pay

## 2020-01-23 ENCOUNTER — Ambulatory Visit (AMBULATORY_SURGERY_CENTER): Payer: Self-pay | Admitting: *Deleted

## 2020-01-23 VITALS — Temp 96.9°F | Ht 69.0 in | Wt 211.0 lb

## 2020-01-23 DIAGNOSIS — Z86010 Personal history of colonic polyps: Secondary | ICD-10-CM

## 2020-01-23 DIAGNOSIS — Z800 Family history of malignant neoplasm of digestive organs: Secondary | ICD-10-CM

## 2020-01-23 MED ORDER — PLENVU 140 G PO SOLR: 1.0000 | Freq: Once | ORAL | 0 refills | Status: AC

## 2020-01-23 NOTE — Progress Notes (Signed)
Patient is here in-person for PV. spouse w/pt in pv tempt=96.9. Patient denies any allergies to eggs or soy. Patient denies any problems with anesthesia/sedation. Patient denies any oxygen use at home. Patient denies taking any diet/weight loss medications or blood thinners. Patient is not being treated for MRSA or C-diff. EMMI education assisgned to the patient for the procedure, this was explained and instructions given to patient. COVID-19 screening test is not ordered, vaccines completed.  Patient is aware of our care-partner policy and 0000000 safety protocol.

## 2020-01-28 ENCOUNTER — Telehealth: Payer: Self-pay | Admitting: Pharmacist

## 2020-01-28 DIAGNOSIS — I10 Essential (primary) hypertension: Secondary | ICD-10-CM

## 2020-01-28 NOTE — Telephone Encounter (Signed)
Attempted to call patient and reassess blood pressure control and symptoms of dizziness.   Left message requesting call back.  I will plan to call her later in the week if she fails to return call.

## 2020-01-28 NOTE — Telephone Encounter (Signed)
Patient returned earlier call.  She reported that she was having less dizziness and her blood pressure control remains at a fairly controlled level. She reported home BP systolic readings of [id number] and 131. Home BP diastolic readings have been ~85.  She desires to have improved control and wondered why she was switched at the pharmacy.   Since stopping the HCTZ she has noted that her legs swell much more.  This swelling is moderately bothersome to her.   Upon further discussion she reports that her medication in 2018 was enalapril 5/12.5mg  and metoprolol tartrate.  She was switched from enalapril/hctz to lisinopril/hctz by the [place].  She inquired if she could go back to her old medication: enalapril HCTZ 5/12.5mg   I shared that I was unsure why the switch was made but I would discuss this "return to previously effective therapy" option with Dr. Andria Frames.   She was OK with waiting today.  She is planning to come to a visit in late April with Dr. Andria Frames.

## 2020-01-29 MED ORDER — ENALAPRIL-HYDROCHLOROTHIAZIDE 5-12.5 MG PO TABS: 1.0000 | ORAL_TABLET | Freq: Every day | ORAL | 3 refills | Status: DC

## 2020-01-29 NOTE — Addendum Note (Signed)
Addended by: Zenia Resides on: 01/29/2020 01:34 PM   Modules accepted: Orders

## 2020-02-04 ENCOUNTER — Encounter: Payer: Self-pay | Admitting: Gastroenterology

## 2020-02-06 ENCOUNTER — Encounter: Payer: Self-pay | Admitting: Gastroenterology

## 2020-02-06 ENCOUNTER — Ambulatory Visit (AMBULATORY_SURGERY_CENTER): Payer: Medicare HMO | Admitting: Gastroenterology

## 2020-02-06 ENCOUNTER — Other Ambulatory Visit: Payer: Self-pay

## 2020-02-06 VITALS — BP 115/70 | HR 67 | Temp 96.2°F | Resp 15 | Ht 69.0 in | Wt 211.0 lb

## 2020-02-06 DIAGNOSIS — D122 Benign neoplasm of ascending colon: Secondary | ICD-10-CM

## 2020-02-06 DIAGNOSIS — Z86010 Personal history of colonic polyps: Secondary | ICD-10-CM | POA: Diagnosis not present

## 2020-02-06 DIAGNOSIS — D123 Benign neoplasm of transverse colon: Secondary | ICD-10-CM | POA: Diagnosis not present

## 2020-02-06 DIAGNOSIS — Z800 Family history of malignant neoplasm of digestive organs: Secondary | ICD-10-CM | POA: Diagnosis not present

## 2020-02-06 DIAGNOSIS — Z1211 Encounter for screening for malignant neoplasm of colon: Secondary | ICD-10-CM | POA: Diagnosis not present

## 2020-02-06 MED ORDER — SODIUM CHLORIDE 0.9 % IV SOLN: 500.0000 mL | Freq: Once | INTRAVENOUS | Status: DC

## 2020-02-06 NOTE — Progress Notes (Signed)
Pt. Reports no change in her medical or surgical history since her pre-visit 01/23/2020.

## 2020-02-06 NOTE — Progress Notes (Signed)
Called to room to assist during endoscopic procedure.  Patient ID and intended procedure confirmed with present staff. Received instructions for my participation in the procedure from the performing physician.  

## 2020-02-06 NOTE — Patient Instructions (Addendum)
Handouts provided on polyps and diverticulosis.  ? ?YOU HAD AN ENDOSCOPIC PROCEDURE TODAY AT THE [facility]:   Refer to the procedure report that was given to you for any specific questions about what was found during the examination.  If the procedure report does not answer your questions, please call your gastroenterologist to clarify.  If you requested that your care partner not be given the details of your procedure findings, then the procedure report has been included in a sealed envelope for you to review at your convenience later. ? ?YOU SHOULD EXPECT: Some feelings of bloating in the abdomen. Passage of more gas than usual.  Walking can help get rid of the air that was put into your GI tract during the procedure and reduce the bloating. If you had a lower endoscopy (such as a colonoscopy or flexible sigmoidoscopy) you may notice spotting of blood in your stool or on the toilet paper. If you underwent a bowel prep for your procedure, you may not have a normal bowel movement for a few days. ? ?Please Note:  You might notice some irritation and congestion in your nose or some drainage.  This is from the oxygen used during your procedure.  There is no need for concern and it should clear up in a day or so. ? ?SYMPTOMS TO REPORT IMMEDIATELY: ? ?Following lower endoscopy (colonoscopy or flexible sigmoidoscopy): ? Excessive amounts of blood in the stool ? Significant tenderness or worsening of abdominal pains ? Swelling of the abdomen that is new, acute ? Fever of 100?F or higher ? ?For urgent or emergent issues, a gastroenterologist can be reached at any hour by calling [phone]. ?Do not use MyChart messaging for urgent concerns.  ? ? ?DIET:  We do recommend a small meal at first, but then you may proceed to your regular diet.  Drink plenty of fluids but you should avoid alcoholic beverages for 24 hours. ? ?ACTIVITY:  You should plan to take it easy for the rest of today and you should NOT  DRIVE or use heavy machinery until tomorrow (because of the sedation medicines used during the test).   ? ?FOLLOW UP: ?Our staff will call the number listed on your records 48-72 hours following your procedure to check on you and address any questions or concerns that you may have regarding the information given to you following your procedure. If we do not reach you, we will leave a message.  We will attempt to reach you two times.  During this call, we will ask if you have developed any symptoms of COVID 19. If you develop any symptoms (ie: fever, flu-like symptoms, shortness of breath, cough etc.) before then, please call [phone].  If you test positive for Covid 19 in the 2 weeks post procedure, please call and report this information to us.   ? ?If any biopsies were taken you will be contacted by phone or by letter within the next 1-3 weeks.  Please call us at [phone] if you have not heard about the biopsies in 3 weeks.  ? ? ?SIGNATURES/CONFIDENTIALITY: ?You and/or your care partner have signed paperwork which will be entered into your electronic medical record.  These signatures attest to the fact that that the information above on your After Visit Summary has been reviewed and is understood.  Full responsibility of the confidentiality of this discharge information lies with you and/or your care-partner. ? ?

## 2020-02-06 NOTE — Op Note (Signed)
[facility] Patient Name: Andrea Robles Procedure Date: 02/06/2020 8:37 AM MRN: [id number] Endoscopist: Remo Lipps P. Havery Moros , MD Age: 69 Referring MD:  Date of Birth: May 17, 1951 Gender: Female Account #: [id number] Procedure:                Colonoscopy Indications:              High risk colon cancer surveillance: Personal                            history of colonic polyps, sister with history of                            colon cancer diagnosed around age 45 Medicines:                Monitored Anesthesia Care Procedure:                Pre-Anesthesia Assessment:                           - Prior to the procedure, a History and Physical                            was performed, and patient medications and                            allergies were reviewed. The patient's tolerance of                            previous anesthesia was also reviewed. The risks                            and benefits of the procedure and the sedation                            options and risks were discussed with the patient.                            All questions were answered, and informed consent                            was obtained. Prior Anticoagulants: The patient has                            taken no previous anticoagulant or antiplatelet                            agents. ASA Grade Assessment: III - A patient with                            severe systemic disease. After reviewing the risks                            and benefits, the patient was deemed in  satisfactory condition to undergo the procedure.                           After obtaining informed consent, the colonoscope                            was passed under direct vision. Throughout the                            procedure, the patient's blood pressure, pulse, and                            oxygen saturations were monitored continuously. The                            Colonoscope was  introduced through the anus and                            advanced to the the cecum, identified by                            appendiceal orifice and ileocecal valve. The                            colonoscopy was performed without difficulty. The                            patient tolerated the procedure well. The quality                            of the bowel preparation was good. The ileocecal                            valve, appendiceal orifice, and rectum were                            photographed. Scope In: 8:46:52 AM Scope Out: 9:06:33 AM Scope Withdrawal Time: 0 hours 10 minutes 49 seconds  Total Procedure Duration: 0 hours 19 minutes 41 seconds  Findings:                 The perianal and digital rectal examinations were                            normal.                           A 5 mm polyp was found in the hepatic flexure. The                            polyp was sessile. The polyp was removed with a                            cold snare. Resection and retrieval were complete.  A 3 mm polyp was found in the ascending colon. The                            polyp was sessile. The polyp was removed with a                            cold snare. Resection and retrieval were complete.                           A 4 mm polyp was found in the transverse colon. The                            polyp was sessile. The polyp was removed with a                            cold snare. Resection and retrieval were complete.                           Multiple medium-mouthed diverticula were found in                            the entire colon. Severe diverticulosis noted in                            the sigmoid with a stenosis / angulated turn in the                            distal sigmoid colon, could not be traversed with                            regular adult colonsocope, pediatric colonoscope                            used to traverse it and complete the  exam.                           The exam was otherwise without abnormality. Complications:            No immediate complications. Estimated blood loss:                            Minimal. Estimated Blood Loss:     Estimated blood loss was minimal. Impression:               - One 5 mm polyp at the hepatic flexure, removed                            with a cold snare. Resected and retrieved.                           - One 3 mm polyp in the ascending colon, removed  with a cold snare. Resected and retrieved.                           - One 4 mm polyp in the transverse colon, removed                            with a cold snare. Resected and retrieved.                           - Diverticulosis in the entire examined colon,                            severe in the left colon with angulated luminal                            narrowing.                           - The examination was otherwise normal. Recommendation:           - Patient has a contact number available for                            emergencies. The signs and symptoms of potential                            delayed complications were discussed with the                            patient. Return to normal activities tomorrow.                            Written discharge instructions were provided to the                            patient.                           - Resume previous diet.                           - Continue present medications.                           - Await pathology results. Remo Lipps P. Yaa Donnellan, MD 02/06/2020 9:12:44 AM This report has been signed electronically.

## 2020-02-06 NOTE — Progress Notes (Signed)
To PACU< VSS. Report to [email or web address] 

## 2020-02-10 ENCOUNTER — Telehealth: Payer: Self-pay

## 2020-02-10 ENCOUNTER — Telehealth: Payer: Self-pay | Admitting: *Deleted

## 2020-02-10 NOTE — Telephone Encounter (Signed)
Left message on 2nd follow up call. 

## 2020-02-10 NOTE — Telephone Encounter (Signed)
Attempted f/u phone call. No answer. Left message. °

## 2020-02-11 ENCOUNTER — Encounter: Payer: Self-pay | Admitting: Gastroenterology

## 2020-02-17 ENCOUNTER — Ambulatory Visit (INDEPENDENT_AMBULATORY_CARE_PROVIDER_SITE_OTHER): Payer: Medicare HMO | Admitting: Family Medicine

## 2020-02-17 ENCOUNTER — Other Ambulatory Visit: Payer: Self-pay

## 2020-02-17 ENCOUNTER — Encounter: Payer: Self-pay | Admitting: Family Medicine

## 2020-02-17 DIAGNOSIS — N1831 Chronic kidney disease, stage 3a: Secondary | ICD-10-CM | POA: Diagnosis not present

## 2020-02-17 DIAGNOSIS — R42 Dizziness and giddiness: Secondary | ICD-10-CM

## 2020-02-17 DIAGNOSIS — I10 Essential (primary) hypertension: Secondary | ICD-10-CM | POA: Diagnosis not present

## 2020-02-17 NOTE — Patient Instructions (Addendum)
There are three types of topical creams for pain (hip pain) 1. Numbing medicine.  Comes in creams or patches.  Most common name is lidocaine.  There is a prescription patch.  It also comes in OTC Salonpas 2. Antiinflammatory (ibuprofen type) medicine.  Most common name is Voltarin Gel.  It used to be prescription but is now over the counter. 3. Heat muscle rubs.  There are a million.  My favorite is Capsicum lotion.  Buy it in a roll on.   I am glad you are less dizzy.  No more changes.    Let me know for sure if your insurance covers home blood sugar tests for prediabetes.  If they do, I am happy to order the machine and strips.

## 2020-02-18 ENCOUNTER — Encounter: Payer: Self-pay | Admitting: Family Medicine

## 2020-02-18 ENCOUNTER — Telehealth: Payer: Self-pay | Admitting: Family Medicine

## 2020-02-18 LAB — BASIC METABOLIC PANEL
BUN/Creatinine Ratio: 15 (ref 12–28)
BUN: 17 mg/dL (ref 8–27)
CO2: 25 mmol/L (ref 20–29)
Calcium: 9.3 mg/dL (ref 8.7–10.3)
Chloride: 100 mmol/L (ref 96–106)
Creatinine, Ser: 1.15 mg/dL — ABNORMAL HIGH (ref 0.57–1.00)
GFR calc Af Amer: 57 mL/min/{1.73_m2} — ABNORMAL LOW (ref >59)
GFR calc non Af Amer: 49 mL/min/{1.73_m2} — ABNORMAL LOW (ref >59)
Glucose: 126 mg/dL — ABNORMAL HIGH (ref 65–99)
Potassium: 4.5 mmol/L (ref 3.5–5.2)
Sodium: 139 mmol/L (ref 134–144)

## 2020-02-18 NOTE — Assessment & Plan Note (Signed)
Good control on old agent (lower dose ACE).  Dizziness markedly improved.  No additional changes.

## 2020-02-18 NOTE — Progress Notes (Signed)
    SUBJECTIVE:   CHIEF COMPLAINT / HPI:   FU hypertension.  Patient feels much less dizzy now that I have switched her back to her original enalapril/HCTZ combo.  I found the old note that I had switched based on a pharmacy request that the drug was on back order.  No CP, SOB or leg swelling. Also asks about anything topical for hip pain.    OBJECTIVE:   BP 114/70   Pulse 64   Ht 5\' 9"  (1.753 m)   Wt 206 lb 6.4 oz (93.6 kg)   SpO2 99%   BMI 30.48 kg/m   BP noted to be lowish.  Lungs clear Cardiac RRR without m or g Ext no edema.  ASSESSMENT/PLAN:   No problem-specific Assessment & Plan notes found for this encounter.     Zenia Resides, MD [place]

## 2020-02-18 NOTE — Assessment & Plan Note (Signed)
Recheck creat since switched ACE

## 2020-02-18 NOTE — Assessment & Plan Note (Signed)
Resolved with BP switch.  Likely from over Rx of HBP

## 2020-02-18 NOTE — Telephone Encounter (Signed)
Called and LM with normal labs.  BS was up, but recent a1c=6

## 2020-07-29 ENCOUNTER — Other Ambulatory Visit: Payer: Self-pay

## 2020-07-29 ENCOUNTER — Encounter: Payer: Self-pay | Admitting: Family Medicine

## 2020-07-29 ENCOUNTER — Ambulatory Visit (INDEPENDENT_AMBULATORY_CARE_PROVIDER_SITE_OTHER): Payer: Medicare HMO | Admitting: Family Medicine

## 2020-07-29 VITALS — BP 140/76 | HR 59 | Ht 69.0 in | Wt 200.8 lb

## 2020-07-29 DIAGNOSIS — I10 Essential (primary) hypertension: Secondary | ICD-10-CM | POA: Diagnosis not present

## 2020-07-29 DIAGNOSIS — E7800 Pure hypercholesterolemia, unspecified: Secondary | ICD-10-CM

## 2020-07-29 DIAGNOSIS — R7303 Prediabetes: Secondary | ICD-10-CM | POA: Diagnosis not present

## 2020-07-29 DIAGNOSIS — R300 Dysuria: Secondary | ICD-10-CM | POA: Diagnosis not present

## 2020-07-29 DIAGNOSIS — M1611 Unilateral primary osteoarthritis, right hip: Secondary | ICD-10-CM

## 2020-07-29 DIAGNOSIS — N1831 Chronic kidney disease, stage 3a: Secondary | ICD-10-CM | POA: Diagnosis not present

## 2020-07-29 DIAGNOSIS — Z23 Encounter for immunization: Secondary | ICD-10-CM | POA: Diagnosis not present

## 2020-07-29 LAB — POCT URINALYSIS DIP (MANUAL ENTRY)
Bilirubin, UA: NEGATIVE
Blood, UA: NEGATIVE
Glucose, UA: NEGATIVE mg/dL
Ketones, POC UA: NEGATIVE mg/dL
Leukocytes, UA: NEGATIVE
Nitrite, UA: NEGATIVE
Protein Ur, POC: NEGATIVE mg/dL
Spec Grav, UA: 1.015 (ref 1.010–1.025)
Urobilinogen, UA: 0.2 E.U./dL
pH, UA: 5 (ref 5.0–8.0)

## 2020-07-29 LAB — POCT GLYCOSYLATED HEMOGLOBIN (HGB A1C): HbA1c, POC (controlled diabetic range): 5.9 % (ref 0.0–7.0)

## 2020-07-29 MED ORDER — TRAMADOL HCL 50 MG PO TABS: 50.0000 mg | ORAL_TABLET | Freq: Two times a day (BID) | ORAL | 1 refills | Status: AC | PRN

## 2020-07-29 NOTE — Patient Instructions (Addendum)
Please check your blood pressure multiple times at home and either call me or drop by a listing to the office.  When you call, also be clear about what medicines you are taking.  I generally like generic medicines.  They are just as good and cost you less.   Try the tramadol for the hip to get you by until you feel safe in having the surgery Someone should call for the orthopedist appointment I will call with the test resulst.

## 2020-07-30 ENCOUNTER — Encounter: Payer: Self-pay | Admitting: Family Medicine

## 2020-07-30 LAB — CMP14+EGFR
ALT: 17 IU/L (ref 0–32)
AST: 21 IU/L (ref 0–40)
Albumin/Globulin Ratio: 1.3 (ref 1.2–2.2)
Albumin: 4.4 g/dL (ref 3.8–4.8)
Alkaline Phosphatase: 96 IU/L (ref 44–121)
BUN/Creatinine Ratio: 16 (ref 12–28)
BUN: 17 mg/dL (ref 8–27)
Bilirubin Total: 0.5 mg/dL (ref 0.0–1.2)
CO2: 25 mmol/L (ref 20–29)
Calcium: 9.9 mg/dL (ref 8.7–10.3)
Chloride: 99 mmol/L (ref 96–106)
Creatinine, Ser: 1.05 mg/dL — ABNORMAL HIGH (ref 0.57–1.00)
GFR calc Af Amer: 63 mL/min/{1.73_m2} (ref >59)
GFR calc non Af Amer: 55 mL/min/{1.73_m2} — ABNORMAL LOW (ref >59)
Globulin, Total: 3.4 g/dL (ref 1.5–4.5)
Glucose: 89 mg/dL (ref 65–99)
Potassium: 3.4 mmol/L — ABNORMAL LOW (ref 3.5–5.2)
Sodium: 139 mmol/L (ref 134–144)
Total Protein: 7.8 g/dL (ref 6.0–8.5)

## 2020-07-30 LAB — LIPID PANEL
Chol/HDL Ratio: 3.8 ratio (ref 0.0–4.4)
Cholesterol, Total: 155 mg/dL (ref 100–199)
HDL: 41 mg/dL (ref >39)
LDL Chol Calc (NIH): 81 mg/dL (ref 0–99)
Triglycerides: 197 mg/dL — ABNORMAL HIGH (ref 0–149)
VLDL Cholesterol Cal: 33 mg/dL (ref 5–40)

## 2020-07-30 LAB — CBC
Hematocrit: 42.9 % (ref 34.0–46.6)
Hemoglobin: 14.6 g/dL (ref 11.1–15.9)
MCH: 30.2 pg (ref 26.6–33.0)
MCHC: 34 g/dL (ref 31.5–35.7)
MCV: 89 fL (ref 79–97)
Platelets: 272 10*3/uL (ref 150–450)
RBC: 4.84 x10E6/uL (ref 3.77–5.28)
RDW: 12.4 % (ref 11.7–15.4)
WBC: 9.5 10*3/uL (ref 3.4–10.8)

## 2020-07-30 NOTE — Assessment & Plan Note (Signed)
No evidence of UTI

## 2020-07-30 NOTE — Progress Notes (Signed)
° ° °  SUBJECTIVE:   CHIEF COMPLAINT / HPI:   Several issues 1. BP up today.  She is concerned with some generic drug switches done by [facility].  Not checking home BP very often.  No CP or SOB 2. Right hip pain.  Pretty severe.  Tried tylenol and multiple topical agents.  Knows she will need hip replacement - but reluctant during [place]. 3. Strong smelling urine.  Mild dysuria.  Wants to be checked for UTI.  No fever or flank pain 4. HPDP due for flu, COVID booster and cholesterol screen 5. Prediabetes.  Wants check for progression.    OBJECTIVE:   BP 140/76    Pulse (!) 59    Ht 5\' 9"  (1.753 m)    Wt 200 lb 12.8 oz (91.1 kg)    SpO2 100%    BMI 29.65 kg/m   Lungs clear Cardiac rRR without m or g Right hip, limited internal and external rotation.  ASSESSMENT/PLAN:   Prediabetes A1C is reassuring - no progression.  She needs hip pain better controled so that she can have a more active lifestyle   Osteoarthritis of right hip Start tramadol so she can be more active.  Ortho referral to get process for hip replacement started.  She can work with ortho on timing.  HYPERTENSION, BENIGN SYSTEMIC Home BP checks.  Increase meds if consistently elevated.  Called about low K.  She will increase dietary potassium  Dysuria No evidence of UTI.    Hypercholesteremia Nice response to statin  CKD (chronic kidney disease) stage 3, GFR 30-59 ml/min No progression.  Slight improvement for unclear reasons     Zenia Resides, MD [place]

## 2020-07-30 NOTE — Assessment & Plan Note (Signed)
A1C is reassuring - no progression.  She needs hip pain better controled so that she can have a more active lifestyle

## 2020-07-30 NOTE — Assessment & Plan Note (Signed)
No progression.  Slight improvement for unclear reasons

## 2020-07-30 NOTE — Assessment & Plan Note (Signed)
Nice response to statin

## 2020-07-30 NOTE — Assessment & Plan Note (Signed)
Home BP checks.  Increase meds if consistently elevated.  Called about low K.  She will increase dietary potassium

## 2020-07-30 NOTE — Assessment & Plan Note (Signed)
Start tramadol so she can be more active.  Ortho referral to get process for hip replacement started.  She can work with ortho on timing.

## 2020-08-05 ENCOUNTER — Telehealth: Payer: Self-pay | Admitting: Family Medicine

## 2020-08-05 DIAGNOSIS — I10 Essential (primary) hypertension: Secondary | ICD-10-CM

## 2020-08-05 MED ORDER — POTASSIUM CHLORIDE ER 10 MEQ PO TBCR: 10.0000 meq | EXTENDED_RELEASE_TABLET | Freq: Every day | ORAL | 3 refills | Status: DC

## 2020-08-05 NOTE — Telephone Encounter (Signed)
Patient dropped off several BP readings, all of which are quite good.  No change in current meds.  Hypokalemia previously noted and patient selected to treat with OJ.  She now wants pills because sugar in OJ causing BS to rise.  Rx sent.

## 2020-08-12 ENCOUNTER — Other Ambulatory Visit: Payer: Self-pay | Admitting: Family Medicine

## 2020-08-20 ENCOUNTER — Ambulatory Visit: Payer: Medicare HMO | Admitting: Orthopaedic Surgery

## 2020-09-27 ENCOUNTER — Other Ambulatory Visit: Payer: Self-pay | Admitting: Family Medicine

## 2020-09-27 DIAGNOSIS — E7800 Pure hypercholesterolemia, unspecified: Secondary | ICD-10-CM

## 2020-10-25 DIAGNOSIS — Z1152 Encounter for screening for COVID-19: Secondary | ICD-10-CM | POA: Diagnosis not present

## 2020-11-02 ENCOUNTER — Other Ambulatory Visit: Payer: Self-pay | Admitting: Family Medicine

## 2020-12-11 ENCOUNTER — Telehealth: Payer: Self-pay

## 2020-12-11 NOTE — Telephone Encounter (Signed)
Patient calls nurse line requesting excuse from jury duty. 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   Please advise.   Talbot Grumbling, RN

## 2020-12-14 NOTE — Telephone Encounter (Signed)
Called.  Letter created and per her request, will mail to her home address.

## 2020-12-20 ENCOUNTER — Other Ambulatory Visit: Payer: Self-pay | Admitting: Family Medicine

## 2020-12-20 DIAGNOSIS — I10 Essential (primary) hypertension: Secondary | ICD-10-CM

## 2020-12-28 ENCOUNTER — Other Ambulatory Visit: Payer: Self-pay | Admitting: Family Medicine

## 2020-12-28 DIAGNOSIS — Z122 Encounter for screening for malignant neoplasm of respiratory organs: Secondary | ICD-10-CM

## 2021-02-04 ENCOUNTER — Telehealth: Payer: Self-pay

## 2021-02-04 DIAGNOSIS — M1611 Unilateral primary osteoarthritis, right hip: Secondary | ICD-10-CM

## 2021-02-04 MED ORDER — TRAMADOL HCL 50 MG PO TABS: 50.0000 mg | ORAL_TABLET | Freq: Three times a day (TID) | ORAL | 5 refills | Status: AC | PRN

## 2021-02-04 NOTE — Telephone Encounter (Signed)
Called patient and discussed.  Rx given 30 pills to last one month.

## 2021-02-04 NOTE — Telephone Encounter (Signed)
Patient calls nurse line requesting refill of tramadol for hip pain. This was last prescribed 08/2020. Patient reports that she needs a hip replacement, however, she has not yet met with surgeon for consult. Patient has been doing more yard work recently and is having increased pain in right hip.   Advised patient that she would likely need an appointment to further evaluate pain and receive rx for pain medicine. Patient is requesting that I send request and would like to wait to hear back from provider before scheduling.   To PCP  Please advise.   Talbot Grumbling, RN

## 2021-02-08 ENCOUNTER — Ambulatory Visit (INDEPENDENT_AMBULATORY_CARE_PROVIDER_SITE_OTHER): Payer: Medicare HMO | Admitting: Family Medicine

## 2021-02-08 ENCOUNTER — Other Ambulatory Visit: Payer: Self-pay

## 2021-02-08 ENCOUNTER — Encounter: Payer: Self-pay | Admitting: Family Medicine

## 2021-02-08 DIAGNOSIS — M109 Gout, unspecified: Secondary | ICD-10-CM | POA: Diagnosis not present

## 2021-02-08 MED ORDER — COLCHICINE 0.6 MG PO TABS: 0.6000 mg | ORAL_TABLET | Freq: Two times a day (BID) | ORAL | 1 refills | Status: DC

## 2021-02-08 NOTE — Patient Instructions (Signed)
Blood test today and I sent in a prescription for a medicine to treat gout.  I think that is what you have. I will call tomorrow with test results.   If It is gout, we will need to change your blood pressure medicine.

## 2021-02-08 NOTE — Assessment & Plan Note (Signed)
Likely gout (clinical dx)  Check serum uric acid and start cochicine.  If gout still likely based on labs, DC HCTZ and switch enalapril to losartan.  In diff dx would be bursitis from her bunion.  If gout unlikely or fails to respond to treatment, consider podiatry referral.

## 2021-02-08 NOTE — Progress Notes (Signed)
    SUBJECTIVE:   CHIEF COMPLAINT / HPI:   Acute onset left great toe pain and left foot swelling 4 days ago.  No trauma.  Severe pain.  Has hx of "bunion"  No severe pain previously.  No fever.  No previous hx of gout.  Looking through labs, I see a serum uric acid of 7.7 done in 2013.  She is also on HCTZ which can worsen gout.   OBJECTIVE:   BP 126/74   Pulse 72   Ht 5\' 9"  (1.753 m)   Wt 208 lb (94.3 kg)   SpO2 98%   BMI 30.72 kg/m    Inflammation of left 1st MTP joint with erythema.  No skin break.  Some swelling of dorsum of foot, no erythema.  ASSESSMENT/PLAN:   Podagra Likely gout (clinical dx)  Check serum uric acid and start cochicine.  If gout still likely based on labs, DC HCTZ and switch enalapril to losartan.  In diff dx would be bursitis from her bunion.  If gout unlikely or fails to respond to treatment, consider podiatry referral.     Zenia Resides, MD [place]

## 2021-02-09 LAB — URIC ACID: Uric Acid: 7.8 mg/dL — ABNORMAL HIGH (ref 3.0–7.2)

## 2021-02-10 ENCOUNTER — Telehealth: Payer: Self-pay

## 2021-02-10 DIAGNOSIS — M109 Gout, unspecified: Secondary | ICD-10-CM

## 2021-02-10 DIAGNOSIS — I10 Essential (primary) hypertension: Secondary | ICD-10-CM

## 2021-02-10 MED ORDER — VALSARTAN 80 MG PO TABS: 80.0000 mg | ORAL_TABLET | Freq: Every day | ORAL | 3 refills | Status: DC

## 2021-02-10 MED ORDER — LOSARTAN POTASSIUM 100 MG PO TABS: 100.0000 mg | ORAL_TABLET | Freq: Every day | ORAL | 3 refills | Status: DC

## 2021-02-10 NOTE — Telephone Encounter (Signed)
Diarrhea and nausea after 4th colchicine pill.  Improving GI sx since off drug x 18 hours.  Stay off and added colchicine to intolerance list.  Foot also improving.  Gout is still my working diagnosis.  As such, will DC HCTZ/enalapril and start losartan

## 2021-02-10 NOTE — Telephone Encounter (Signed)
Patient started colchicine on Monday-  Had had a total of 4 pills. Reports that Tuesday evening she was up all night with nausea, vomiting and diarrhea and abdominal pain. Denies fever. Patient believes this could be related to the colchicine. Advised patient of supportive measures. Patient reports that episodes have been less frequent this morning. Patient has been sipping ginger ale and has been able to hold this down. Provided with strict ED precautions.   Please advise next steps.   Talbot Grumbling, RN

## 2021-02-24 MED ORDER — PREDNISONE 20 MG PO TABS: 20.0000 mg | ORAL_TABLET | Freq: Two times a day (BID) | ORAL | 0 refills | Status: DC

## 2021-02-24 NOTE — Telephone Encounter (Signed)
Called and discussed.  Will Rx with prednisone.  She will also make a follow up appointment with me.

## 2021-02-24 NOTE — Telephone Encounter (Signed)
Pt calls because she tried the Colchicine again, "just to make sure" yesterday (5/3).  She reports that it did not help, and caused a little bit of stomach issues.  Her foot is still hurting her and she would like to know what the next option is.  To pcp. Christen Bame, CMA

## 2021-02-24 NOTE — Addendum Note (Signed)
Addended by: Zenia Resides on: 02/24/2021 11:04 AM   Modules accepted: Orders

## 2021-03-17 ENCOUNTER — Encounter: Payer: Self-pay | Admitting: Family Medicine

## 2021-03-17 ENCOUNTER — Other Ambulatory Visit: Payer: Self-pay

## 2021-03-17 ENCOUNTER — Ambulatory Visit (INDEPENDENT_AMBULATORY_CARE_PROVIDER_SITE_OTHER): Payer: Medicare HMO

## 2021-03-17 ENCOUNTER — Ambulatory Visit (INDEPENDENT_AMBULATORY_CARE_PROVIDER_SITE_OTHER): Payer: Medicare HMO | Admitting: Family Medicine

## 2021-03-17 DIAGNOSIS — M109 Gout, unspecified: Secondary | ICD-10-CM

## 2021-03-17 DIAGNOSIS — I10 Essential (primary) hypertension: Secondary | ICD-10-CM | POA: Diagnosis not present

## 2021-03-17 DIAGNOSIS — Z0000 Encounter for general adult medical examination without abnormal findings: Secondary | ICD-10-CM

## 2021-03-17 DIAGNOSIS — Z23 Encounter for immunization: Secondary | ICD-10-CM

## 2021-03-17 DIAGNOSIS — M21612 Bunion of left foot: Secondary | ICD-10-CM

## 2021-03-17 DIAGNOSIS — N1831 Chronic kidney disease, stage 3a: Secondary | ICD-10-CM | POA: Diagnosis not present

## 2021-03-17 NOTE — Patient Instructions (Signed)
I will call with test results.   We will talk about meds when I call.

## 2021-03-18 ENCOUNTER — Encounter: Payer: Self-pay | Admitting: Family Medicine

## 2021-03-18 DIAGNOSIS — M21612 Bunion of left foot: Secondary | ICD-10-CM | POA: Insufficient documentation

## 2021-03-18 LAB — BASIC METABOLIC PANEL
BUN/Creatinine Ratio: 12 (ref 12–28)
BUN: 13 mg/dL (ref 8–27)
CO2: 23 mmol/L (ref 20–29)
Calcium: 9.1 mg/dL (ref 8.7–10.3)
Chloride: 103 mmol/L (ref 96–106)
Creatinine, Ser: 1.13 mg/dL — ABNORMAL HIGH (ref 0.57–1.00)
Glucose: 99 mg/dL (ref 65–99)
Potassium: 4 mmol/L (ref 3.5–5.2)
Sodium: 141 mmol/L (ref 134–144)
eGFR: 53 mL/min/{1.73_m2} — ABNORMAL LOW (ref >59)

## 2021-03-18 LAB — URIC ACID: Uric Acid: 5.4 mg/dL (ref 3.0–7.2)

## 2021-03-18 NOTE — Progress Notes (Signed)
    SUBJECTIVE:   CHIEF COMPLAINT / HPI:   Annual visit: Acute problmes.   1. Continued pain in left great toe.  Initially attributed to gout.  Severe pain has resolved but does have significant lingering pain.  No fever or chills.  Redness at toe but denies foot or leg redness.  Foot pain has limited activity some.  She has gained a couple of pounds because she is not walking as much as before. Chronic problems: 1. Hx of presumed gout.  Wants Uric acid rechecked off thiazide and on losartan. 2. Hypertension, rescently switched to losartan.  BP great today.  Still on potassium, but HCTZ has been stopped and on an ARB. 3. History of high cholesterol, on statin and at goal.  Next lipid panel due in the fall. 4. CKD 3a HPDP  Has had COVID x 3.  Will get 2nd booster today. Otherwise up to date.   PERTINENT  PMH / PSH: denies CP, DOE, bleeding, worrisome skin changes. No change in bowel, bladder, appetite or wt   OBJECTIVE:   BP 104/60   Pulse 69   Ht 5\' 9"  (1.753 m)   Wt 208 lb 3.2 oz (94.4 kg)   SpO2 98%   BMI 30.75 kg/m   HEENT WNL Neck supple without mass Lungs clear Cardiac RRR without m or g abd benign Ext no edema.  Left great to bunion at MTP joint. She has it padded and is wearing appropriate shoes.   Neuro, Motor, sensory, gait, affect, and cognition all grossly normal.  ASSESSMENT/PLAN:   HYPERTENSION, BENIGN SYSTEMIC Called, potassium normal  And no longer on thiazide.  Will DC potassium and recheck in one month.  Future order entered.  Routine general medical examination at a health care facility Healthy female with no at risk behaviors.  CKD (chronic kidney disease) stage 3, GFR 30-59 ml/min Nicely stable on current meds.  Acute gout Uric acid nicely down off HCTZ and on losartan.  I do not think gout is causing her current toe pain.  Bunion of great toe of left foot Mechanical pain.  She is already wearing good shoes and using padding.  Offered podiatry  referral, declined for now.  She will call if worsens.     Zenia Resides, MD [place]

## 2021-03-18 NOTE — Assessment & Plan Note (Signed)
Mechanical pain.  She is already wearing good shoes and using padding.  Offered podiatry referral, declined for now.  She will call if worsens.

## 2021-03-18 NOTE — Assessment & Plan Note (Signed)
Uric acid nicely down off HCTZ and on losartan.  I do not think gout is causing her current toe pain.

## 2021-03-18 NOTE — Assessment & Plan Note (Signed)
Called, potassium normal  And no longer on thiazide.  Will DC potassium and recheck in one month.  Future order entered.

## 2021-03-18 NOTE — Assessment & Plan Note (Signed)
Healthy female with no at risk behaviors.

## 2021-03-18 NOTE — Assessment & Plan Note (Signed)
Nicely stable on current meds.

## 2021-03-24 ENCOUNTER — Telehealth: Payer: Self-pay

## 2021-03-24 DIAGNOSIS — M21612 Bunion of left foot: Secondary | ICD-10-CM

## 2021-03-24 NOTE — Telephone Encounter (Signed)
Patient calls nurse line requesting for provider to place referral for Podiatrist.   Patient was recently seen in office on 03/17/21.  Please advise.   Talbot Grumbling, RN

## 2021-03-31 ENCOUNTER — Ambulatory Visit (INDEPENDENT_AMBULATORY_CARE_PROVIDER_SITE_OTHER): Payer: Medicare HMO

## 2021-03-31 ENCOUNTER — Other Ambulatory Visit: Payer: Self-pay | Admitting: Podiatry

## 2021-03-31 ENCOUNTER — Ambulatory Visit (INDEPENDENT_AMBULATORY_CARE_PROVIDER_SITE_OTHER): Payer: Medicare HMO | Admitting: Podiatry

## 2021-03-31 ENCOUNTER — Other Ambulatory Visit: Payer: Self-pay

## 2021-03-31 DIAGNOSIS — M79672 Pain in left foot: Secondary | ICD-10-CM

## 2021-03-31 DIAGNOSIS — M109 Gout, unspecified: Secondary | ICD-10-CM

## 2021-03-31 DIAGNOSIS — M778 Other enthesopathies, not elsewhere classified: Secondary | ICD-10-CM | POA: Diagnosis not present

## 2021-03-31 MED ORDER — TRIAMCINOLONE ACETONIDE 10 MG/ML IJ SUSP
10.0000 mg | Freq: Once | INTRAMUSCULAR | Status: AC
  Administered 2021-03-31: 10 mg

## 2021-03-31 MED ORDER — ALLOPURINOL 100 MG PO TABS
100.0000 mg | ORAL_TABLET | Freq: Every day | ORAL | 6 refills | Status: DC
Start: 2021-03-31 — End: 2021-07-15

## 2021-03-31 NOTE — Progress Notes (Signed)
Subjective:   Patient ID: Andrea Robles, female   DOB: 70 y.o.   MRN: [id number]   HPI Patient presents stating that she has had swelling around the big toe joint of the left foot that has had 2 attacks with previous colchicine she cannot tolerate an steroid pack and that it is very swollen again and she is had blood work done with tentative diagnosis of gout.  States it is very sore and makes it hard for her to ambulate.  Patient does not smoke likes to be active and presents with caregiver   Review of Systems  All other systems reviewed and are negative.       Objective:  Physical Exam Vitals and nursing note reviewed.  Constitutional:      Appearance: She is well-developed.  Pulmonary:     Effort: Pulmonary effort is normal.  Musculoskeletal:        General: Normal range of motion.  Skin:    General: Skin is warm.  Neurological:     Mental Status: She is alert.     Neurovascular status found to be intact muscle strength was found to be adequate range of motion adequate.  Patient is noted to have on the left foot inflammation fluid of the first MPJ painful when pressed with pain mostly on the medial side and slightly plantar.  Patient has good digital perfusion well oriented x3 no proximal edema erythema drainage noted     Assessment:  Inflammatory condition possible capsulitis possible gout or combination     Plan:  H&P reviewed condition did sterile prep and injected the first MPJ medial plantar 3 mg Kenalog 5 mg Xylocaine placed on allopurinol despite get uric acid running normal just because she had another attack and we may take her off it again in a couple months and she will follow-up with family physician.  If symptoms persist she will see [place] back  X-rays indicate small area of pitting of the left first metatarsal head but I do not think it is related to the discomfort she is experiencing now with mild bunion

## 2021-03-31 NOTE — Patient Instructions (Signed)
Gout  Gout is painful swelling of your joints. Gout is a type of arthritis. It is caused by having too much uric acid in your body. Uric acid is a chemical that is made when your body breaks down substances called purines. If your body has too much uric acid, sharp crystals can form and build up in your joints. This causes pain and swelling. Gout attacks can happen quickly and be very painful (acute gout). Over time, the attacks can affect more joints and happen more often (chronic gout). What are the causes?  Too much uric acid in your blood. This can happen because: ? Your kidneys do not remove enough uric acid from your blood. ? Your body makes too much uric acid. ? You eat too many foods that are high in purines. These foods include organ meats, some seafood, and beer.  Trauma or stress. What increases the risk?  Having a family history of gout.  Being female and middle-aged.  Being female and having gone through menopause.  Being very overweight (obese).  Drinking alcohol, especially beer.  Not having enough water in the body (being dehydrated).  Losing weight too quickly.  Having an organ transplant.  Having lead poisoning.  Taking certain medicines.  Having kidney disease.  Having a skin condition called psoriasis. What are the signs or symptoms? An attack of acute gout usually happens in just one joint. The most common place is the big toe. Attacks often start at night. Other joints that may be affected include joints of the feet, ankle, knee, fingers, wrist, or elbow. Symptoms of an attack may include:  Very bad pain.  Warmth.  Swelling.  Stiffness.  Shiny, red, or purple skin.  Tenderness. The affected joint may be very painful to touch.  Chills and fever. Chronic gout may cause symptoms more often. More joints may be involved. You may also have white or yellow lumps (tophi) on your hands or feet or in other areas near your joints.   How is this  treated?  Treatment for this condition has two phases: treating an acute attack and preventing future attacks.  Acute gout treatment may include: ? NSAIDs. ? Steroids. These are taken by mouth or injected into a joint. ? Colchicine. This medicine relieves pain and swelling. It can be given by mouth or through an IV tube.  Preventive treatment may include: ? Taking small doses of NSAIDs or colchicine daily. ? Using a medicine that reduces uric acid levels in your blood. ? Making changes to your diet. You may need to see a food expert (dietitian) about what to eat and drink to prevent gout. Follow these instructions at home: During a gout attack  If told, put ice on the painful area: ? Put ice in a plastic bag. ? Place a towel between your skin and the bag. ? Leave the ice on for 20 minutes, 2-3 times a day.  Raise (elevate) the painful joint above the level of your heart as often as you can.  Rest the joint as much as possible. If the joint is in your leg, you may be given crutches.  Follow instructions from your doctor about what you cannot eat or drink.   Avoiding future gout attacks  Eat a low-purine diet. Avoid foods and drinks such as: ? Liver. ? Kidney. ? Anchovies. ? Asparagus. ? Herring. ? Mushrooms. ? Mussels. ? Beer.  Stay at a healthy weight. If you want to lose weight, talk with your doctor. Do   not lose weight too fast.  Start or continue an exercise plan as told by your doctor. Eating and drinking  Drink enough fluids to keep your pee (urine) pale yellow.  If you drink alcohol: ? Limit how much you use to:  0-1 drink a day for women.  0-2 drinks a day for men. ? Be aware of how much alcohol is in your drink. In the [place] one drink equals one 12 oz bottle of beer (355 mL), one 5 oz glass of wine (148 mL), or one 1 oz glass of hard liquor (44 mL). General instructions  Take over-the-counter and prescription medicines only as told by your doctor.  Do  not drive or use heavy machinery while taking prescription pain medicine.  Return to your normal activities as told by your doctor. Ask your doctor what activities are safe for you.  Keep all follow-up visits as told by your doctor. This is important. Contact a doctor if:  You have another gout attack.  You still have symptoms of a gout attack after 10 days of treatment.  You have problems (side effects) because of your medicines.  You have chills or a fever.  You have burning pain when you pee (urinate).  You have pain in your lower back or belly. Get help right away if:  You have very bad pain.  Your pain cannot be controlled.  You cannot pee. Summary  Gout is painful swelling of the joints.  The most common site of pain is the big toe, but it can affect other joints.  Medicines and avoiding some foods can help to prevent and treat gout attacks. This information is not intended to replace advice given to you by your health care provider. Make sure you discuss any questions you have with your health care provider. Document Revised: 05/02/2018 Document Reviewed: 05/02/2018 Elsevier Patient Education  2021 [employer]  

## 2021-04-16 ENCOUNTER — Encounter (HOSPITAL_COMMUNITY): Payer: Self-pay | Admitting: Emergency Medicine

## 2021-04-16 ENCOUNTER — Ambulatory Visit (HOSPITAL_COMMUNITY)
Admission: EM | Admit: 2021-04-16 | Discharge: 2021-04-16 | Disposition: A | Discharge status: Home / Self Care | Payer: Medicare HMO | Attending: Emergency Medicine | Admitting: Emergency Medicine

## 2021-04-16 DIAGNOSIS — L03113 Cellulitis of right upper limb: Secondary | ICD-10-CM | POA: Diagnosis not present

## 2021-04-16 MED ORDER — HYDROXYZINE HCL 10 MG PO TABS: 10.0000 mg | ORAL_TABLET | Freq: Four times a day (QID) | ORAL | 0 refills | Status: DC

## 2021-04-16 MED ORDER — DOXYCYCLINE HYCLATE 100 MG PO CAPS: 100.0000 mg | ORAL_CAPSULE | Freq: Two times a day (BID) | ORAL | 0 refills | Status: DC

## 2021-04-16 NOTE — ED Triage Notes (Signed)
Pt presents with left hand redness, swelling, and irritation that started yesterday. Unsure what bit her.

## 2021-04-16 NOTE — ED Provider Notes (Signed)
[facility]    CSN: [id number] Arrival date & time: 04/16/21  1035      History   Chief Complaint Chief Complaint  Patient presents with   Insect Bite    HPI Andrea Robles is a 70 y.o. female.   Patient here for evaluation of left hand redness and swelling after working in the garden yesterday.  Small blister noted to left middle finger.  Reports having some pain and itching associated with redness and warmth.  Unsure if she was bitten by any insects.  Reports taking a Benadryl last night.  Denies any trauma, injury, or other precipitating event.  Denies any fevers, chest pain, shortness of breath, N/V/D, numbness, tingling, weakness, abdominal pain, or headaches.    The history is provided by the patient.   Past Medical History:  Diagnosis Date   Allergy    Hypercholesteremia    Hypertension     Patient Active Problem List   Diagnosis Date Noted   Bunion of great toe of left foot 03/18/2021   Acute gout 02/08/2021   Encounter for screening for lung cancer 01/01/2020   CKD (chronic kidney disease) stage 3, GFR 30-59 ml/min ([place]) 01/01/2020   Actinic keratosis 06/08/2017   Osteoarthritis of right hip 11/03/2016   Midline low back pain without sciatica 10/05/2012   Hypercholesteremia 04/16/2012   Prediabetes 04/11/2012   Routine general medical examination at a health care facility 05/13/2011   COPD 06/27/2008   CERVICAL RADICULOPATHY 10/19/2007   Quit smoking 12/21/2006   HYPERTENSION, BENIGN SYSTEMIC 12/21/2006   Hx of adenomatous polyp of colon 12/21/2006    Past Surgical History:  Procedure Laterality Date   ABDOMINAL HYSTERECTOMY     CHOLECYSTECTOMY     COLONOSCOPY  01/19/2015   POLYPECTOMY      OB History   No obstetric history on file.      Home Medications    Prior to Admission medications   Medication Sig Start Date End Date Taking? Authorizing Provider  doxycycline (VIBRAMYCIN) 100 MG capsule Take 1 capsule (100 mg total) by mouth  2 (two) times daily. 04/16/21  Yes Pearson Forster, NP  hydrOXYzine (ATARAX/VISTARIL) 10 MG tablet Take 1 tablet (10 mg total) by mouth every 6 (six) hours. 04/16/21  Yes Pearson Forster, NP  allopurinol (ZYLOPRIM) 100 MG tablet Take 1 tablet (100 mg total) by mouth daily. 03/31/21   Wallene Huh, DPM  aspirin EC 81 MG tablet Take 1 tablet (81 mg total) by mouth daily. 10/04/18   Zenia Resides, MD  atorvastatin (LIPITOR) 10 MG tablet Take 1 tablet by mouth once daily 09/28/20   Zenia Resides, MD  cetirizine (ZYRTEC) 10 MG tablet Take 10 mg by mouth daily.    [provider]  LORazepam (ATIVAN) 0.5 MG tablet Take 1 tablet by mouth twice daily as needed for anxiety 08/12/20   Zenia Resides, MD  losartan (COZAAR) 100 MG tablet Take 1 tablet (100 mg total) by mouth at bedtime. 02/10/21   Zenia Resides, MD  metoprolol tartrate (LOPRESSOR) 25 MG tablet Take 1 tablet by mouth twice daily 11/02/20   Zenia Resides, MD    Family History Family History  Problem Relation Age of Onset   Colon cancer Sister 88   Lung cancer Sister    Colon cancer Brother 22       METS   Other Maternal Grandfather        ? colon cancer seen on  2005 colon report   Colon cancer Maternal Grandfather    Esophageal cancer Neg Hx    Rectal cancer Neg Hx    Stomach cancer Neg Hx     Social History Social History   Tobacco Use   Smoking status: Former    Packs/day: 0.50    Years: 44.00    Pack years: 22.00    Types: Cigarettes    Quit date: 03/27/2012    Years since quitting: 9.0   Smokeless tobacco: Never  Vaping Use   Vaping Use: Never used  Substance Use Topics   Alcohol use: Yes    Comment: ocassionally-rare   Drug use: No     Allergies   Statins, Colchicine, Hydrocodone, Famotidine, Flexeril [cyclobenzaprine], Metronidazole, and Nortriptyline   Review of Systems Review of Systems  Musculoskeletal:  Positive for joint swelling.  Skin:  Positive for color change and wound.   All other systems reviewed and are negative.   Physical Exam Triage Vital Signs ED Triage Vitals  Enc Vitals Group     BP 04/16/21 1108 (!) 164/96     Pulse Rate 04/16/21 1108 65     Resp 04/16/21 1108 16     Temp 04/16/21 1108 98.5 F (36.9 C)     Temp Source 04/16/21 1108 Oral     SpO2 04/16/21 1108 98 %     Weight --      Height --      Head Circumference --      Peak Flow --      Pain Score 04/16/21 1106 0     Pain Loc --      Pain Edu? --      Excl. in [place]? --    No data found.  Updated Vital Signs BP (!) 164/96 (BP Location: Right Arm)   Pulse 65   Temp 98.5 F (36.9 C) (Oral)   Resp 16   SpO2 98%   Visual Acuity Right Eye Distance:   Left Eye Distance:   Bilateral Distance:    Right Eye Near:   Left Eye Near:    Bilateral Near:     Physical Exam Vitals and nursing note reviewed.  Constitutional:      General: She is not in acute distress.    Appearance: Normal appearance. She is not ill-appearing, toxic-appearing or diaphoretic.  HENT:     Head: Normocephalic and atraumatic.  Eyes:     Conjunctiva/sclera: Conjunctivae normal.  Cardiovascular:     Rate and Rhythm: Normal rate.     Pulses: Normal pulses.  Pulmonary:     Effort: Pulmonary effort is normal.  Abdominal:     General: Abdomen is flat.  Musculoskeletal:        General: Normal range of motion.     Left hand: Swelling (erythema and redness noted to left hand) present. No deformity, lacerations, tenderness or bony tenderness. Normal range of motion.     Cervical back: Normal range of motion.  Skin:    General: Skin is warm and dry.     Findings: Erythema and lesion (small blister to left index finger) present. No laceration or rash.  Neurological:     General: No focal deficit present.     Mental Status: She is alert and oriented to person, place, and time.  Psychiatric:        Mood and Affect: Mood normal.        UC Treatments / Results  Labs (all labs ordered are listed,  but only abnormal results are displayed) Labs Reviewed - No data to display  EKG   Radiology No results found.  Procedures Procedures (including critical care time)  Medications Ordered in UC Medications - No data to display  Initial Impression / Assessment and Plan / UC Course  I have reviewed the triage vital signs and the nursing notes.  Pertinent labs & imaging results that were available during my care of the patient were reviewed by me and considered in my medical decision making (see chart for details).    Assessment negative for red flags or concerns.  Likely cellulitis.  Will treat with doxycycline twice daily for the next 10 days.  Also prescribed Atarax as needed for itching and possible contact dermatitis.  Instructed patient not to take Atarax prior to driving as it can cause drowsiness.  May also use anti-itch creams and lotions as needed.  May use ice or alternate between heat and ice for comfort.  Instructed patient to return for reevaluation if redness, swelling, or warmth gets worse in the next few days.  Otherwise follow-up with primary care as needed. Final Clinical Impressions(s) / UC Diagnoses   Final diagnoses:  Cellulitis of right upper extremity     Discharge Instructions      Take the Doxycycline twice a day for the next 10 days.   You can take the Atarax as needed for itching.  It can make you sleepy so do not take it prior to driving. You can also apply anti-itch creams and lotions as needed.    You can apply ice for comfort.  You can also apply heat or alternate between heat and ice.    If the redness, swelling, or warmth gets worse, please come back or go to the Emergency Department for re-evaluation.    Return or go to the Emergency Department if symptoms worsen or do not improve in the next few days.      ED Prescriptions     Medication Sig Dispense Auth. Provider   doxycycline (VIBRAMYCIN) 100 MG capsule Take 1 capsule (100 mg total) by  mouth 2 (two) times daily. 20 capsule Pearson Forster, NP   hydrOXYzine (ATARAX/VISTARIL) 10 MG tablet Take 1 tablet (10 mg total) by mouth every 6 (six) hours. 30 tablet Pearson Forster, NP      PDMP not reviewed this encounter.   Pearson Forster, NP 04/16/21 [phone]

## 2021-04-16 NOTE — Discharge Instructions (Addendum)
Take the Doxycycline twice a day for the next 10 days.   You can take the Atarax as needed for itching.  It can make you sleepy so do not take it prior to driving. You can also apply anti-itch creams and lotions as needed.    You can apply ice for comfort.  You can also apply heat or alternate between heat and ice.    If the redness, swelling, or warmth gets worse, please come back or go to the Emergency Department for re-evaluation.    Return or go to the Emergency Department if symptoms worsen or do not improve in the next few days.

## 2021-04-19 ENCOUNTER — Other Ambulatory Visit: Payer: Medicare HMO

## 2021-04-19 ENCOUNTER — Other Ambulatory Visit: Payer: Self-pay

## 2021-04-19 DIAGNOSIS — I10 Essential (primary) hypertension: Secondary | ICD-10-CM | POA: Diagnosis not present

## 2021-04-20 LAB — BASIC METABOLIC PANEL
BUN/Creatinine Ratio: 22 (ref 12–28)
BUN: 24 mg/dL (ref 8–27)
CO2: 21 mmol/L (ref 20–29)
Calcium: 8.9 mg/dL (ref 8.7–10.3)
Chloride: 102 mmol/L (ref 96–106)
Creatinine, Ser: 1.1 mg/dL — ABNORMAL HIGH (ref 0.57–1.00)
Glucose: 104 mg/dL — ABNORMAL HIGH (ref 65–99)
Potassium: 4.6 mmol/L (ref 3.5–5.2)
Sodium: 140 mmol/L (ref 134–144)
eGFR: 54 mL/min/{1.73_m2} — ABNORMAL LOW (ref >59)

## 2021-05-11 ENCOUNTER — Ambulatory Visit: Payer: Medicare HMO | Admitting: Podiatry

## 2021-06-06 DIAGNOSIS — I499 Cardiac arrhythmia, unspecified: Secondary | ICD-10-CM | POA: Diagnosis not present

## 2021-06-06 DIAGNOSIS — R69 Illness, unspecified: Secondary | ICD-10-CM | POA: Diagnosis not present

## 2021-06-06 DIAGNOSIS — J309 Allergic rhinitis, unspecified: Secondary | ICD-10-CM | POA: Diagnosis not present

## 2021-06-06 DIAGNOSIS — E669 Obesity, unspecified: Secondary | ICD-10-CM | POA: Diagnosis not present

## 2021-06-06 DIAGNOSIS — M109 Gout, unspecified: Secondary | ICD-10-CM | POA: Diagnosis not present

## 2021-06-06 DIAGNOSIS — G8929 Other chronic pain: Secondary | ICD-10-CM | POA: Diagnosis not present

## 2021-06-06 DIAGNOSIS — E785 Hyperlipidemia, unspecified: Secondary | ICD-10-CM | POA: Diagnosis not present

## 2021-06-06 DIAGNOSIS — I129 Hypertensive chronic kidney disease with stage 1 through stage 4 chronic kidney disease, or unspecified chronic kidney disease: Secondary | ICD-10-CM | POA: Diagnosis not present

## 2021-06-06 DIAGNOSIS — M1990 Unspecified osteoarthritis, unspecified site: Secondary | ICD-10-CM | POA: Diagnosis not present

## 2021-06-06 DIAGNOSIS — Z008 Encounter for other general examination: Secondary | ICD-10-CM | POA: Diagnosis not present

## 2021-06-06 DIAGNOSIS — K08409 Partial loss of teeth, unspecified cause, unspecified class: Secondary | ICD-10-CM | POA: Diagnosis not present

## 2021-07-08 IMAGING — MG MM DIGITAL SCREENING BILAT W/ TOMO W/ CAD
6 of 10 series · 6 of 30 positions shown · non-contrast
Comparison: Previous exam(s).

CLINICAL DATA: Screening.

EXAM:
DIGITAL SCREENING BILATERAL MAMMOGRAM WITH TOMO AND CAD

[L MLO synth-2D]
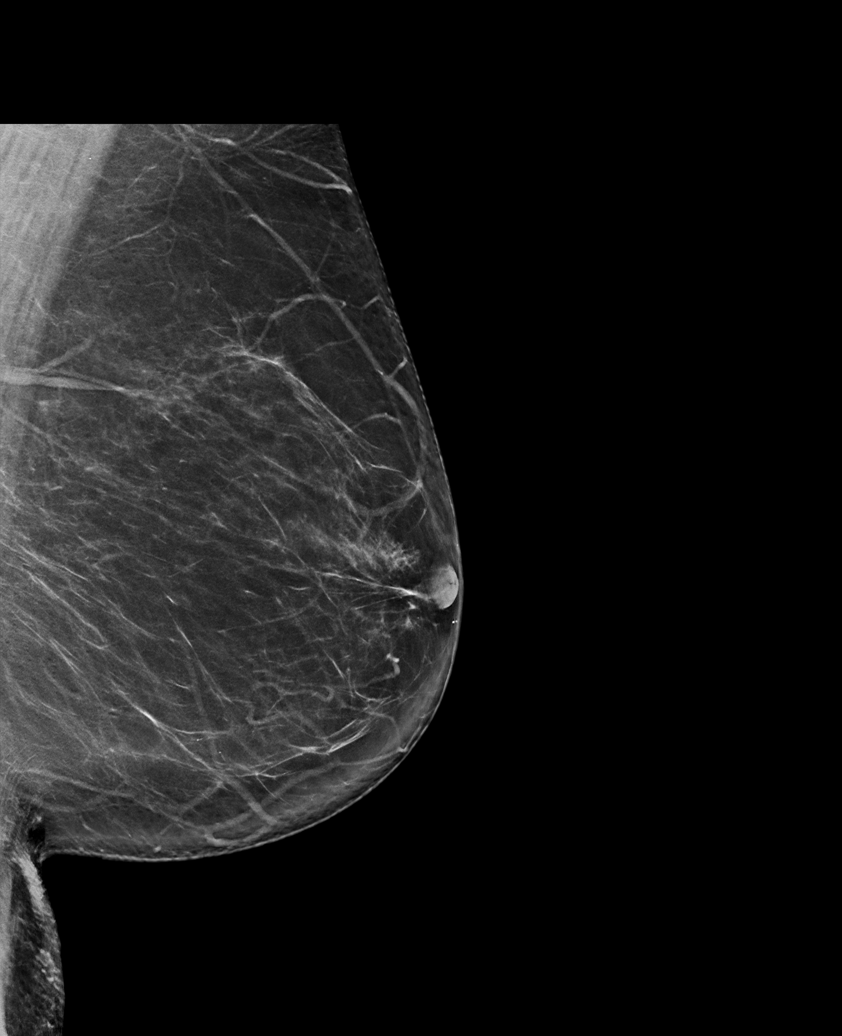

[R CC synth-2D]
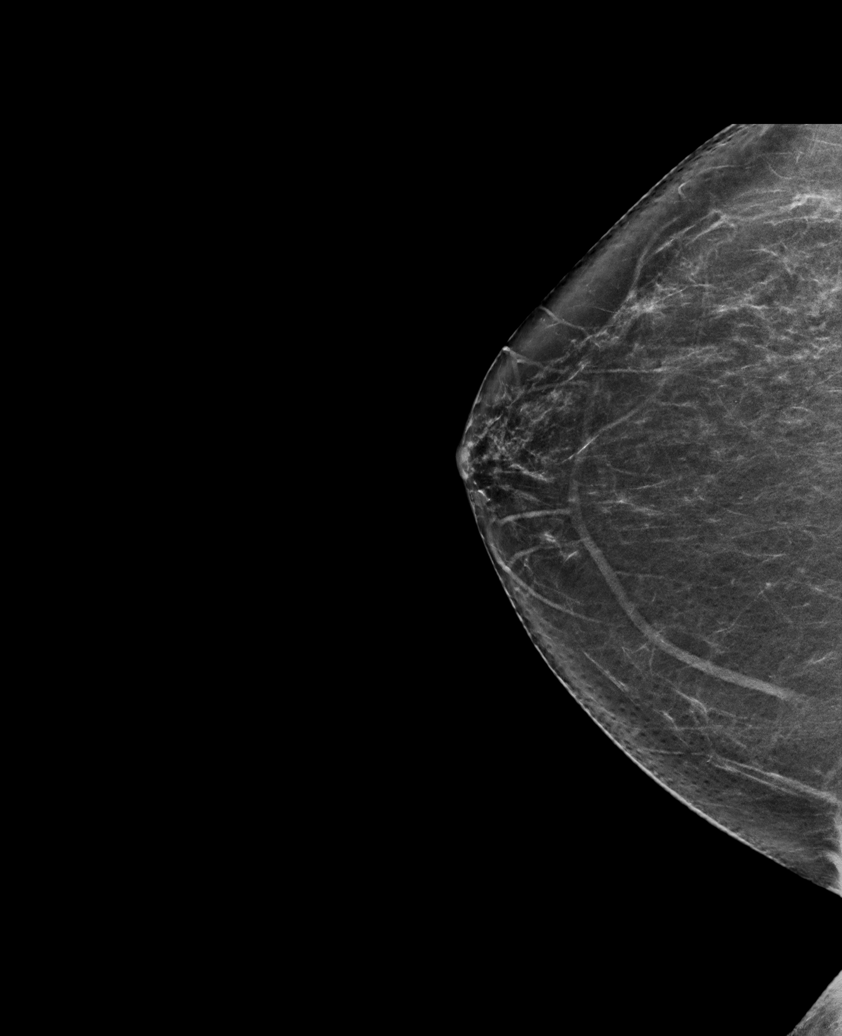

[R MLO synth-2D (1 of 2)]
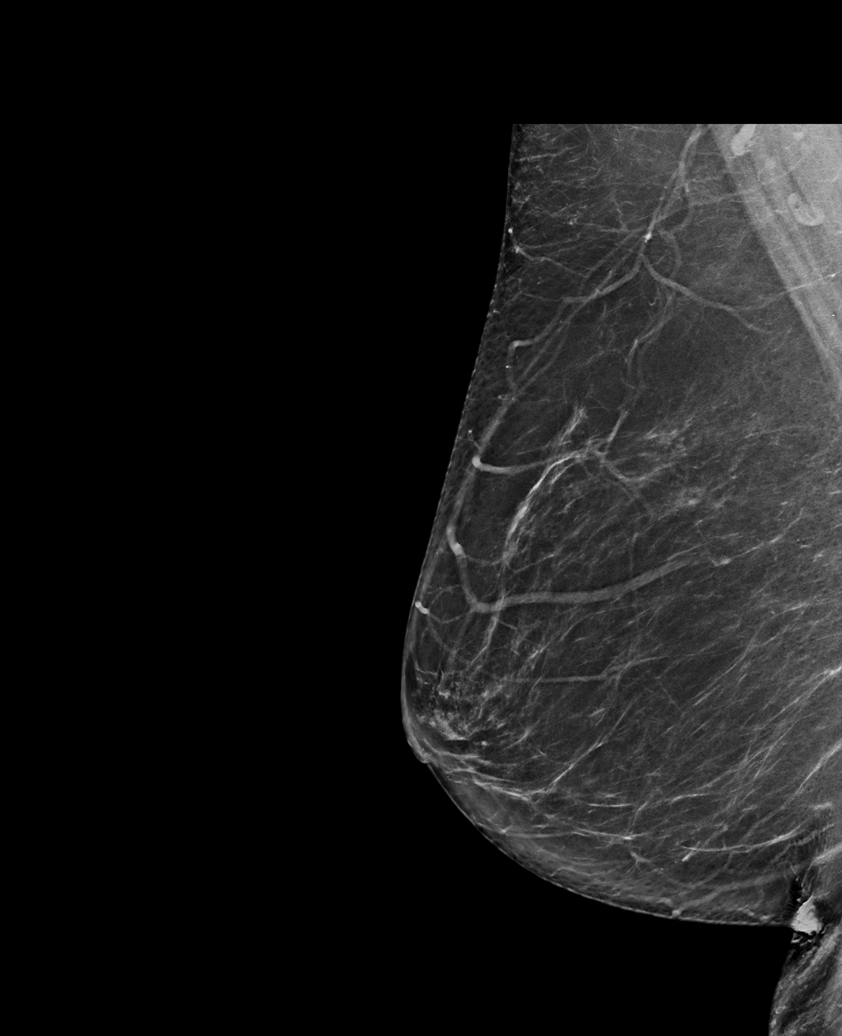

[R MLO synth-2D (2 of 2)]
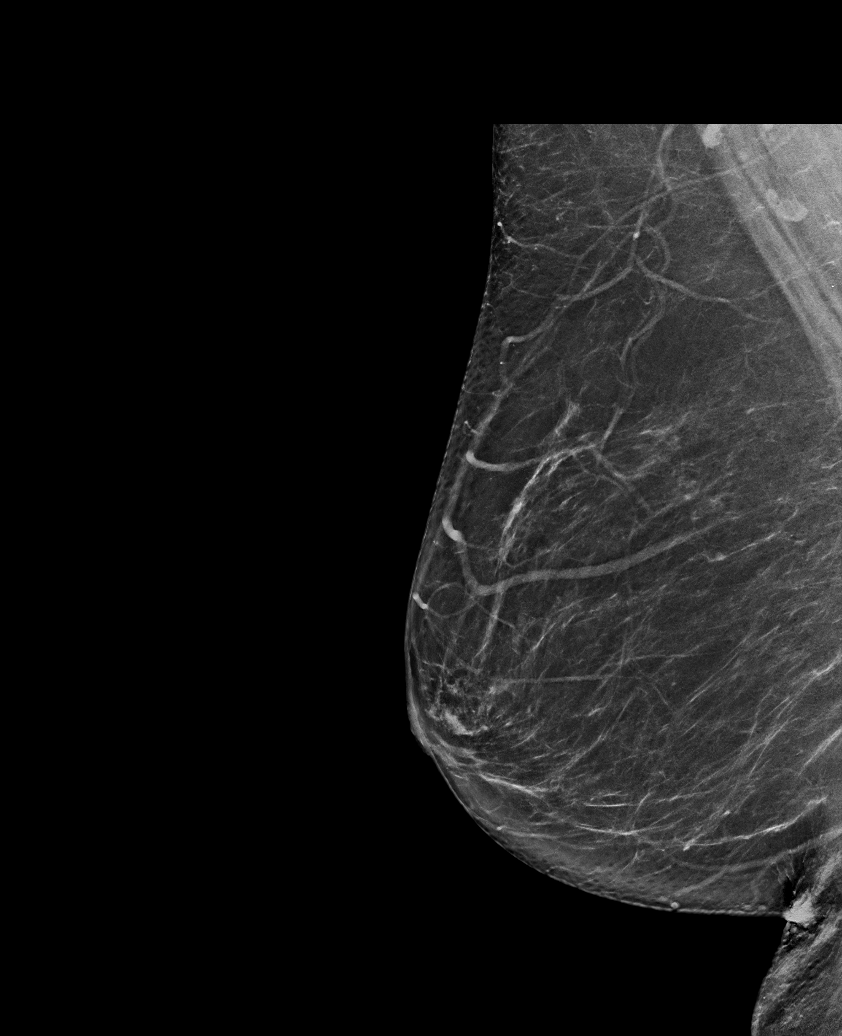

[L CC synth-2D]
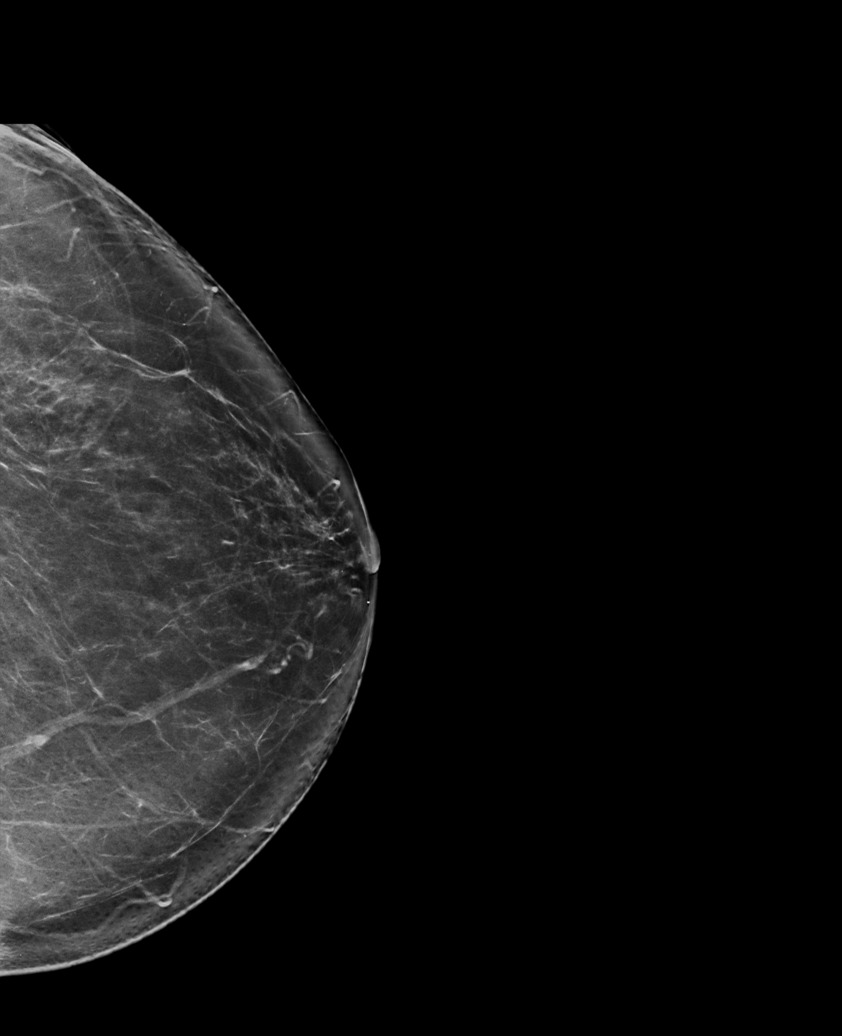

[R CC tomo · tomo slice 37/72.0]
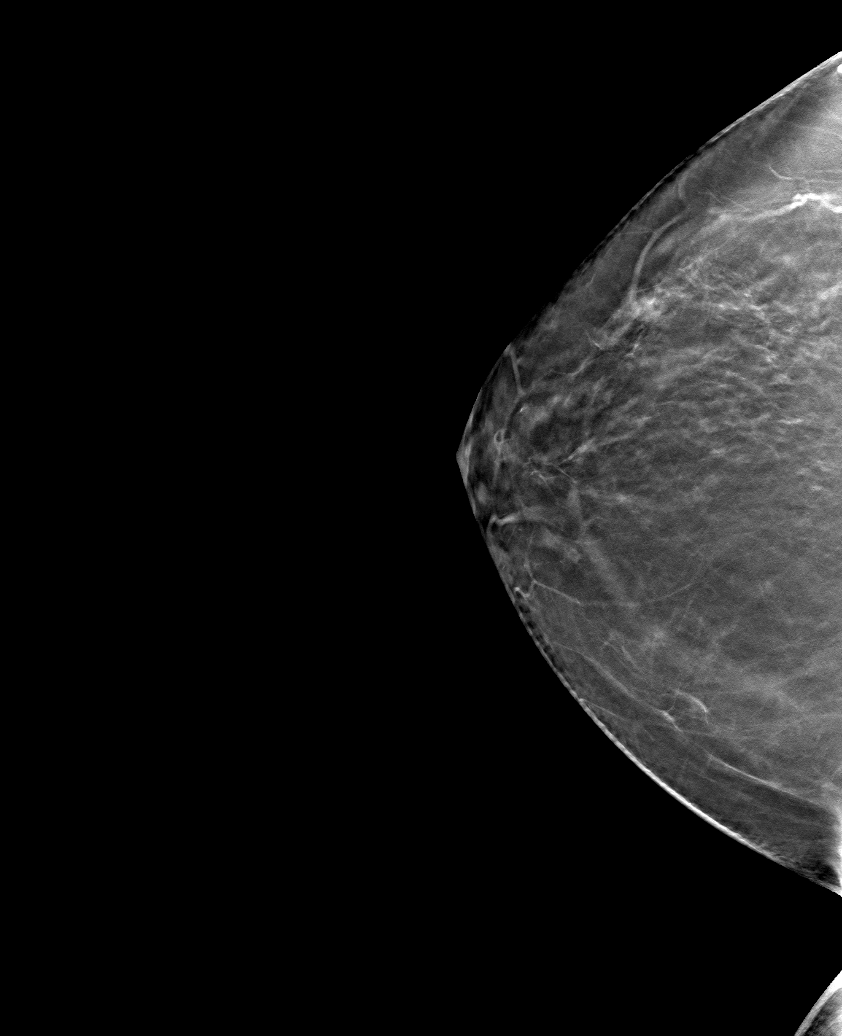

[6 of 30 positions shown; findings below may reference images not displayed]

ACR Breast Density Category b: There are scattered areas of
fibroglandular density.
FINDINGS: There are no findings suspicious for malignancy. Images were
processed with CAD.
IMPRESSION: No mammographic evidence of malignancy. A result letter of this
screening mammogram will be mailed directly to the patient.

RECOMMENDATION:
Screening mammogram in one year. (Code:[id number])

BI-RADS CATEGORY  1: Negative.

## 2021-07-15 ENCOUNTER — Encounter: Payer: Self-pay | Admitting: Family Medicine

## 2021-07-15 ENCOUNTER — Ambulatory Visit (INDEPENDENT_AMBULATORY_CARE_PROVIDER_SITE_OTHER): Payer: Medicare HMO | Admitting: Family Medicine

## 2021-07-15 ENCOUNTER — Other Ambulatory Visit: Payer: Self-pay

## 2021-07-15 VITALS — BP 118/62 | HR 66 | Ht 69.0 in | Wt 204.8 lb

## 2021-07-15 DIAGNOSIS — E7800 Pure hypercholesterolemia, unspecified: Secondary | ICD-10-CM | POA: Diagnosis not present

## 2021-07-15 DIAGNOSIS — N1831 Chronic kidney disease, stage 3a: Secondary | ICD-10-CM | POA: Diagnosis not present

## 2021-07-15 DIAGNOSIS — L680 Hirsutism: Secondary | ICD-10-CM | POA: Diagnosis not present

## 2021-07-15 DIAGNOSIS — Z23 Encounter for immunization: Secondary | ICD-10-CM

## 2021-07-15 MED ORDER — EFLORNITHINE HCL 13.9 % EX CREA: TOPICAL_CREAM | CUTANEOUS | 2 refills | Status: DC

## 2021-07-15 MED ORDER — ATORVASTATIN CALCIUM 20 MG PO TABS: 20.0000 mg | ORAL_TABLET | Freq: Every day | ORAL | 3 refills | Status: DC

## 2021-07-15 NOTE — Patient Instructions (Addendum)
CKD=chronic kidney disease.  You have only the very earliest stage of CKD. Please stop taking the aspirin every day.   Also do not take any more allopurinol.   We will try the 20 mg dose of the atorvastatin. I also sent in the hair removal cream. I believe my chart is correct, not mixed up.

## 2021-07-15 NOTE — Assessment & Plan Note (Signed)
She was intollerant of high dose statin.  Currently doing well on atorvastatin 10.  She wants to try 20 mg daily to see if she will tolerate and if we can get her below 70.

## 2021-07-15 NOTE — Assessment & Plan Note (Signed)
Rx of vaniqa as requested.

## 2021-07-15 NOTE — Progress Notes (Signed)
    SUBJECTIVE:   CHIEF COMPLAINT / HPI:   Had nurse visit from insurance that raised some alarms.   "The chart is mixed up.  It said I had a pacemaker."  (Husband has pacemaker)  We reviewed Epic.  No problem found on our end. Asked about CKD.  I diagnosed with CKD 3a as I switched her off her HCTZ and onto losartan for an episode of presumed gout and mild decrease in GFR.  Educated and answered questions. Asks about cholesterol.  My chart is correct - she is primary prevention.  Explained goals of depend on primary versus secondary prevention.   Hirsuitism: wants to try vaniqa.   ABI done by nurse = .9    OBJECTIVE:   BP 118/62   Pulse 66   Ht 5\' 9"  (1.753 m)   Wt 204 lb 12.8 oz (92.9 kg)   SpO2 99%   BMI 30.24 kg/m   Lung clear Cardiac RRR without m or g Good pedal pulses.   ASSESSMENT/PLAN:   Hypercholesteremia She was intollerant of high dose statin.  Currently doing well on atorvastatin 10.  She wants to try 20 mg daily to see if she will tolerate and if we can get her below 70.   CKD (chronic kidney disease) stage 3, GFR 30-59 ml/min Stable.  Likely due to her HBP  Hirsutism Rx of vaniqa as requested.     Zenia Resides, MD [place]

## 2021-07-15 NOTE — Assessment & Plan Note (Signed)
Stable.  Likely due to her HBP

## 2021-09-15 ENCOUNTER — Encounter: Payer: Self-pay | Admitting: Emergency Medicine

## 2021-09-15 ENCOUNTER — Ambulatory Visit
Admission: EM | Admit: 2021-09-15 | Discharge: 2021-09-15 | Disposition: A | Discharge status: Home / Self Care | Payer: Medicare HMO | Attending: Internal Medicine | Admitting: Internal Medicine

## 2021-09-15 ENCOUNTER — Other Ambulatory Visit: Payer: Self-pay

## 2021-09-15 DIAGNOSIS — H65193 Other acute nonsuppurative otitis media, bilateral: Secondary | ICD-10-CM

## 2021-09-15 DIAGNOSIS — J069 Acute upper respiratory infection, unspecified: Secondary | ICD-10-CM

## 2021-09-15 DIAGNOSIS — J029 Acute pharyngitis, unspecified: Secondary | ICD-10-CM | POA: Diagnosis not present

## 2021-09-15 LAB — POCT RAPID STREP A (OFFICE): Rapid Strep A Screen: NEGATIVE

## 2021-09-15 LAB — POCT INFLUENZA A/B
Influenza A, POC: NEGATIVE
Influenza B, POC: NEGATIVE

## 2021-09-15 MED ORDER — BENZONATATE 100 MG PO CAPS: 100.0000 mg | ORAL_CAPSULE | Freq: Three times a day (TID) | ORAL | 0 refills | Status: DC | PRN

## 2021-09-15 MED ORDER — PREDNISONE 20 MG PO TABS: 40.0000 mg | ORAL_TABLET | Freq: Every day | ORAL | 0 refills | Status: AC

## 2021-09-15 NOTE — ED Triage Notes (Signed)
Patient c/o nasal congestion, cough, congestion, fever x 2 days.  Patient has been taken OTC cough meds.  Patient is vaccinated for COVID.

## 2021-09-15 NOTE — ED Provider Notes (Signed)
[facility]    CSN: [id number] Arrival date & time: 09/15/21  0957      History   Chief Complaint Chief Complaint  Patient presents with   Nasal Congestion    HPI Andrea Robles is a 70 y.o. female.   Patient presents with 2-day history of nasal congestion, nonproductive cough, fever.  T-max at home was 101.  Patient reports that grandchild had similar symptoms recently.  Patient has taken Coricidin HBP over-the-counter with minimal improvement in symptoms.  Denies nausea, vomiting, diarrhea, abdominal pain.  Denies chest pain, shortness of breath.    Past Medical History:  Diagnosis Date   Allergy    Hypercholesteremia    Hypertension     Patient Active Problem List   Diagnosis Date Noted   Hirsutism 07/15/2021   Bunion of great toe of left foot 03/18/2021   Acute gout 02/08/2021   Encounter for screening for lung cancer 01/01/2020   CKD (chronic kidney disease) stage 3, GFR 30-59 ml/min (HCC) 01/01/2020   Actinic keratosis 06/08/2017   Osteoarthritis of right hip 11/03/2016   Midline low back pain without sciatica 10/05/2012   Hypercholesteremia 04/16/2012   Prediabetes 04/11/2012   Routine general medical examination at a health care facility 05/13/2011   COPD 06/27/2008   CERVICAL RADICULOPATHY 10/19/2007   Quit smoking 12/21/2006   HYPERTENSION, BENIGN SYSTEMIC 12/21/2006   Hx of adenomatous polyp of colon 12/21/2006    Past Surgical History:  Procedure Laterality Date   ABDOMINAL HYSTERECTOMY     CHOLECYSTECTOMY     COLONOSCOPY  01/19/2015   POLYPECTOMY      OB History   No obstetric history on file.      Home Medications    Prior to Admission medications   Medication Sig Start Date End Date Taking? Authorizing Provider  atorvastatin (LIPITOR) 20 MG tablet Take 1 tablet (20 mg total) by mouth daily. 07/15/21  Yes Hensel, Jamal Collin, MD  benzonatate (TESSALON) 100 MG capsule Take 1 capsule (100 mg total) by mouth every 8 (eight)  hours as needed for cough. 09/15/21  Yes Mikeria Valin, Hildred Alamin E, FNP  cetirizine (ZYRTEC) 10 MG tablet Take 10 mg by mouth daily.   Yes [provider]  Eflornithine HCl 13.9 % cream Apply twice daily to remove hair. 07/15/21  Yes Hensel, Jamal Collin, MD  hydrOXYzine (ATARAX/VISTARIL) 10 MG tablet Take 1 tablet (10 mg total) by mouth every 6 (six) hours. 04/16/21  Yes Pearson Forster, NP  LORazepam (ATIVAN) 0.5 MG tablet Take 1 tablet by mouth twice daily as needed for anxiety 08/12/20  Yes Hensel, Jamal Collin, MD  losartan (COZAAR) 100 MG tablet Take 1 tablet (100 mg total) by mouth at bedtime. 02/10/21  Yes Hensel, Jamal Collin, MD  metoprolol tartrate (LOPRESSOR) 25 MG tablet Take 1 tablet by mouth twice daily 11/02/20  Yes Hensel, Jamal Collin, MD  predniSONE (DELTASONE) 20 MG tablet Take 2 tablets (40 mg total) by mouth daily for 5 days. 09/15/21 09/20/21 Yes Teodora Medici, FNP    Family History Family History  Problem Relation Age of Onset   Colon cancer Sister 26   Lung cancer Sister    Colon cancer Brother 88       METS   Other Maternal Grandfather        ? colon cancer seen on 2005 colon report   Colon cancer Maternal Grandfather    Esophageal cancer Neg Hx    Rectal cancer Neg Hx  Stomach cancer Neg Hx     Social History Social History   Tobacco Use   Smoking status: Former    Packs/day: 0.50    Years: 44.00    Pack years: 22.00    Types: Cigarettes    Quit date: 03/27/2012    Years since quitting: 9.4   Smokeless tobacco: Never  Vaping Use   Vaping Use: Never used  Substance Use Topics   Alcohol use: Yes    Comment: ocassionally-rare   Drug use: No     Allergies   Statins, Colchicine, Hydrocodone, Famotidine, Flexeril [cyclobenzaprine], Metronidazole, and Nortriptyline   Review of Systems Review of Systems Per HPI  Physical Exam Triage Vital Signs ED Triage Vitals [09/15/21 1126]  Enc Vitals Group     BP (!) 150/78     Pulse Rate 73     Resp 20     Temp  98.4 F (36.9 C)     Temp Source Oral     SpO2 96 %     Weight 200 lb (90.7 kg)     Height 5\' 9"  (1.753 m)     Head Circumference      Peak Flow      Pain Score 0     Pain Loc      Pain Edu?      Excl. in [place]?    No data found.  Updated Vital Signs BP (!) 150/78 (BP Location: Left Arm)   Pulse 73   Temp 98.4 F (36.9 C) (Oral)   Resp 20   Ht 5\' 9"  (1.753 m)   Wt 200 lb (90.7 kg)   SpO2 96%   BMI 29.53 kg/m   Visual Acuity Right Eye Distance:   Left Eye Distance:   Bilateral Distance:    Right Eye Near:   Left Eye Near:    Bilateral Near:     Physical Exam Constitutional:      General: She is not in acute distress.    Appearance: Normal appearance. She is not toxic-appearing or diaphoretic.  HENT:     Head: Normocephalic and atraumatic.     Right Ear: Ear canal normal. A middle ear effusion is present. Tympanic membrane is not perforated, erythematous or bulging.     Left Ear: Ear canal normal. A middle ear effusion is present. Tympanic membrane is not perforated, erythematous or bulging.     Nose: Congestion present.     Mouth/Throat:     Mouth: Mucous membranes are moist.     Pharynx: Posterior oropharyngeal erythema present.  Eyes:     Extraocular Movements: Extraocular movements intact.     Conjunctiva/sclera: Conjunctivae normal.     Pupils: Pupils are equal, round, and reactive to light.  Cardiovascular:     Rate and Rhythm: Normal rate and regular rhythm.     Pulses: Normal pulses.     Heart sounds: Normal heart sounds.  Pulmonary:     Effort: Pulmonary effort is normal. No respiratory distress.     Breath sounds: Normal breath sounds. No stridor. No wheezing, rhonchi or rales.  Abdominal:     General: Abdomen is flat. Bowel sounds are normal.     Palpations: Abdomen is soft.  Musculoskeletal:        General: Normal range of motion.     Cervical back: Normal range of motion.  Skin:    General: Skin is warm and dry.  Neurological:     General:  No focal deficit present.  Mental Status: She is alert and oriented to person, place, and time. Mental status is at baseline.  Psychiatric:        Mood and Affect: Mood normal.        Behavior: Behavior normal.     UC Treatments / Results  Labs (all labs ordered are listed, but only abnormal results are displayed) Labs Reviewed  CULTURE, GROUP A STREP ([place])  NOVEL CORONAVIRUS, NAA  POCT INFLUENZA A/B  POCT RAPID STREP A (OFFICE)    EKG   Radiology No results found.  Procedures Procedures (including critical care time)  Medications Ordered in UC Medications - No data to display  Initial Impression / Assessment and Plan / UC Course  I have reviewed the triage vital signs and the nursing notes.  Pertinent labs & imaging results that were available during my care of the patient were reviewed by me and considered in my medical decision making (see chart for details).     Patient presents with symptoms likely from a viral upper respiratory infection. Differential includes bacterial pneumonia, sinusitis, allergic rhinitis, Covid 19. Do not suspect underlying cardiopulmonary process. Symptoms seem unlikely related to ACS, CHF or COPD exacerbations, pneumonia, pneumothorax. Patient is nontoxic appearing and not in need of emergent medical intervention.  Rapid flu and rapid strep are negative.  Throat culture and COVID-19 viral swab are pending.  Recommended symptom control with over the counter medications that are safe with high blood pressure.  Prednisone and benzonatate prescribed.  Return if symptoms fail to improve in 1-2 weeks or you develop shortness of breath, chest pain, severe headache. Patient states understanding and is agreeable.  Discharged with PCP followup.  Final Clinical Impressions(s) / UC Diagnoses   Final diagnoses:  Viral upper respiratory tract infection with cough  Acute MEE (middle ear effusion), bilateral  Sore throat     Discharge  Instructions      Your rapid strep and rapid flu test were negative.  COVID-19 viral swab is pending.  We will call if it is positive.  You have been prescribed 2 medications to help alleviate symptoms.    ED Prescriptions     Medication Sig Dispense Auth. Provider   predniSONE (DELTASONE) 20 MG tablet Take 2 tablets (40 mg total) by mouth daily for 5 days. 10 tablet [place], [place] E, [place]   benzonatate (TESSALON) 100 MG capsule Take 1 capsule (100 mg total) by mouth every 8 (eight) hours as needed for cough. 21 capsule [place], Michele Rockers, [place]      PDMP not reviewed this encounter.   Teodora Medici, [place] 09/15/21 1225

## 2021-09-15 NOTE — Discharge Instructions (Addendum)
Your rapid strep and rapid flu test were negative.  COVID-19 viral swab is pending.  We will call if it is positive.  You have been prescribed 2 medications to help alleviate symptoms.

## 2021-09-16 LAB — SARS-COV-2, NAA 2 DAY TAT

## 2021-09-16 LAB — NOVEL CORONAVIRUS, NAA: SARS-CoV-2, NAA: NOT DETECTED

## 2021-09-18 LAB — CULTURE, GROUP A STREP (THRC)

## 2021-09-27 ENCOUNTER — Telehealth: Payer: Self-pay

## 2021-09-27 DIAGNOSIS — J019 Acute sinusitis, unspecified: Secondary | ICD-10-CM

## 2021-09-27 DIAGNOSIS — J329 Chronic sinusitis, unspecified: Secondary | ICD-10-CM | POA: Insufficient documentation

## 2021-09-27 MED ORDER — AMOXICILLIN-POT CLAVULANATE 875-125 MG PO TABS
1.0000 | ORAL_TABLET | Freq: Two times a day (BID) | ORAL | 0 refills | Status: DC
Start: 2021-09-27 — End: 2022-05-31

## 2021-09-27 NOTE — Telephone Encounter (Signed)
Patient calls nurse line requesting to speak with Dr. Andria Frames regarding cough medication. Patient states that she spoke with Dr. Andria Frames last week, who advised her to call back on Monday if symptoms have not improved. Patient states that provider was going to call her in an additional cough medication.   I am unable to find documentation of this in patient's chart. Will forward to PCP for further clarification.   Talbot Grumbling, RN

## 2021-09-27 NOTE — Telephone Encounter (Signed)
Called.  Now sick x 10 days.  Will rx as sinusitis.  Augmentin Rx sent.

## 2021-10-11 ENCOUNTER — Other Ambulatory Visit: Payer: Self-pay | Admitting: Family Medicine

## 2021-10-11 ENCOUNTER — Other Ambulatory Visit: Payer: Self-pay

## 2021-10-11 ENCOUNTER — Ambulatory Visit (INDEPENDENT_AMBULATORY_CARE_PROVIDER_SITE_OTHER): Payer: Medicare HMO

## 2021-10-11 DIAGNOSIS — Z23 Encounter for immunization: Secondary | ICD-10-CM | POA: Diagnosis not present

## 2021-10-11 DIAGNOSIS — J019 Acute sinusitis, unspecified: Secondary | ICD-10-CM

## 2021-10-11 MED ORDER — FLUCONAZOLE 150 MG PO TABS: 150.0000 mg | ORAL_TABLET | Freq: Once | ORAL | 0 refills | Status: AC

## 2021-10-11 NOTE — Assessment & Plan Note (Signed)
Given diflucan for yeast infection that followed her sinusitis treatment.

## 2021-11-02 DIAGNOSIS — H2513 Age-related nuclear cataract, bilateral: Secondary | ICD-10-CM | POA: Diagnosis not present

## 2021-11-15 ENCOUNTER — Other Ambulatory Visit: Payer: Self-pay | Admitting: Family Medicine

## 2021-12-20 ENCOUNTER — Other Ambulatory Visit: Payer: Self-pay

## 2021-12-20 DIAGNOSIS — I10 Essential (primary) hypertension: Secondary | ICD-10-CM

## 2021-12-20 DIAGNOSIS — E7800 Pure hypercholesterolemia, unspecified: Secondary | ICD-10-CM

## 2021-12-20 MED ORDER — ATORVASTATIN CALCIUM 20 MG PO TABS: 20.0000 mg | ORAL_TABLET | Freq: Every day | ORAL | 3 refills | Status: DC

## 2021-12-20 MED ORDER — LOSARTAN POTASSIUM 100 MG PO TABS: 100.0000 mg | ORAL_TABLET | Freq: Every day | ORAL | 3 refills | Status: DC

## 2021-12-20 MED ORDER — METOPROLOL TARTRATE 25 MG PO TABS
25.0000 mg | ORAL_TABLET | Freq: Two times a day (BID) | ORAL | 3 refills | Status: DC
Start: 2021-12-20 — End: 2022-11-28

## 2022-03-29 ENCOUNTER — Encounter: Payer: Self-pay | Admitting: *Deleted

## 2022-05-09 ENCOUNTER — Encounter: Payer: Self-pay | Admitting: Family Medicine

## 2022-05-09 ENCOUNTER — Ambulatory Visit (INDEPENDENT_AMBULATORY_CARE_PROVIDER_SITE_OTHER): Payer: Medicare Other | Admitting: Family Medicine

## 2022-05-09 DIAGNOSIS — I739 Peripheral vascular disease, unspecified: Secondary | ICD-10-CM | POA: Diagnosis not present

## 2022-05-09 DIAGNOSIS — I10 Essential (primary) hypertension: Secondary | ICD-10-CM

## 2022-05-09 DIAGNOSIS — Z122 Encounter for screening for malignant neoplasm of respiratory organs: Secondary | ICD-10-CM

## 2022-05-09 DIAGNOSIS — N1831 Chronic kidney disease, stage 3a: Secondary | ICD-10-CM

## 2022-05-09 DIAGNOSIS — Z1231 Encounter for screening mammogram for malignant neoplasm of breast: Secondary | ICD-10-CM | POA: Diagnosis not present

## 2022-05-09 DIAGNOSIS — M1611 Unilateral primary osteoarthritis, right hip: Secondary | ICD-10-CM | POA: Diagnosis not present

## 2022-05-09 DIAGNOSIS — J449 Chronic obstructive pulmonary disease, unspecified: Secondary | ICD-10-CM | POA: Diagnosis not present

## 2022-05-09 DIAGNOSIS — Z0000 Encounter for general adult medical examination without abnormal findings: Secondary | ICD-10-CM | POA: Diagnosis not present

## 2022-05-09 DIAGNOSIS — R7303 Prediabetes: Secondary | ICD-10-CM | POA: Diagnosis not present

## 2022-05-09 DIAGNOSIS — E7800 Pure hypercholesterolemia, unspecified: Secondary | ICD-10-CM | POA: Diagnosis not present

## 2022-05-09 LAB — POCT GLYCOSYLATED HEMOGLOBIN (HGB A1C): HbA1c, POC (controlled diabetic range): 5.9 % (ref 0.0–7.0)

## 2022-05-09 NOTE — Patient Instructions (Addendum)
I ordered ultrasound to check the circulation of your legs.  Also, a CT of your chest for lung cancer screen.  Someone should call.   I will call with blood test results.   I put in a referral to orthopedics.

## 2022-05-10 ENCOUNTER — Encounter: Payer: Self-pay | Admitting: Family Medicine

## 2022-05-10 DIAGNOSIS — Z1239 Encounter for other screening for malignant neoplasm of breast: Secondary | ICD-10-CM | POA: Insufficient documentation

## 2022-05-10 LAB — CMP14+EGFR
ALT: 15 IU/L (ref 0–32)
AST: 19 IU/L (ref 0–40)
Albumin/Globulin Ratio: 1.6 (ref 1.2–2.2)
Albumin: 4.2 g/dL (ref 3.9–4.9)
Alkaline Phosphatase: 98 IU/L (ref 44–121)
BUN/Creatinine Ratio: 14 (ref 12–28)
BUN: 15 mg/dL (ref 8–27)
Bilirubin Total: 0.6 mg/dL (ref 0.0–1.2)
CO2: 22 mmol/L (ref 20–29)
Calcium: 9.2 mg/dL (ref 8.7–10.3)
Chloride: 105 mmol/L (ref 96–106)
Creatinine, Ser: 1.09 mg/dL — ABNORMAL HIGH (ref 0.57–1.00)
Globulin, Total: 2.6 g/dL (ref 1.5–4.5)
Glucose: 107 mg/dL — ABNORMAL HIGH (ref 70–99)
Potassium: 4.7 mmol/L (ref 3.5–5.2)
Sodium: 141 mmol/L (ref 134–144)
Total Protein: 6.8 g/dL (ref 6.0–8.5)
eGFR: 55 mL/min/{1.73_m2} — ABNORMAL LOW (ref >59)

## 2022-05-10 LAB — CBC
Hematocrit: 42.6 % (ref 34.0–46.6)
Hemoglobin: 14.5 g/dL (ref 11.1–15.9)
MCH: 30.1 pg (ref 26.6–33.0)
MCHC: 34 g/dL (ref 31.5–35.7)
MCV: 88 fL (ref 79–97)
Platelets: 227 10*3/uL (ref 150–450)
RBC: 4.82 x10E6/uL (ref 3.77–5.28)
RDW: 12.6 % (ref 11.7–15.4)
WBC: 6.1 10*3/uL (ref 3.4–10.8)

## 2022-05-10 LAB — TSH: TSH: 0.837 u[IU]/mL (ref 0.450–4.500)

## 2022-05-10 LAB — LIPID PANEL
Chol/HDL Ratio: 3.6 ratio (ref 0.0–4.4)
Cholesterol, Total: 163 mg/dL (ref 100–199)
HDL: 45 mg/dL (ref >39)
LDL Chol Calc (NIH): 91 mg/dL (ref 0–99)
Triglycerides: 153 mg/dL — ABNORMAL HIGH (ref 0–149)
VLDL Cholesterol Cal: 27 mg/dL (ref 5–40)

## 2022-05-10 MED ORDER — DICLOFENAC SODIUM 1 % EX CREA: TOPICAL_CREAM | CUTANEOUS | 3 refills | Status: DC

## 2022-05-10 NOTE — Progress Notes (Signed)
    SUBJECTIVE:   CHIEF COMPLAINT / HPI:   Annual physical and more Chronic problems: Severe right hip pain limiting activity.  Known osteoarthritis.  I bellieved last year that she should consider elective hip replacement.  She did not follow through on ortho referral last year.  She is ready to see ortho this year. Dupytrin's contractures both hands and some hand pain and thickening of flexure tendons.   Concern for PVD/circulation to lower ext.  Feet are always cold and at times turn blue.  Cannot tell about claudication because she cannot do much with her hip pain.  Risk factors are hypertension, high cholesterol and hx of smoking. Some DOE.  Smoking hx.  She has COPD but is not on meds.  Her smoking hx qualifies her for annual  lung cancer screening for which she is due.  She also endorses some ankle swelling High cholesterol.  Currently primary prevention.  However, if does have PVD (problem #3) she would change to secondary prvention with lower LDL goal. Strong Family history of thyroid problems. Acute problems: None  HPDP: Due for flu and shingrix.  Has had bivalent covid booster.  Due for mammo   PERTINENT  PMH / PSH: Denies CP, palpitations, bleeding, worrisome skin changes, focal numbness or weakness  OBJECTIVE:   BP 136/65   Pulse 64   Ht '5\' 9"'$  (1.753 m)   Wt 204 lb 9.6 oz (92.8 kg)   SpO2 98%   BMI 30.21 kg/m   HEENT normal Neck supple Cardiac RRR without m or g Abd benign Ext trace bilateral edema.  Diminished pulses in feet.  Feet are warm with good cap refill. Neuro: grossly normal motor, sensory, gait cognition and affect.  ASSESSMENT/PLAN:   Routine general medical examination at a health care facility Generally healthy with several problems that will need FU. Recommended FU visit this fall for possible flu, RSV and Covid booster.    HYPERTENSION, BENIGN SYSTEMIC Well controled on current meds  Breast cancer screening Ordered screening  mammo.  Intermittent claudication ([place]) Not so much claudication but diminished pulses plus loss of hair and cold feet.  Tried to do ABIs in office.  Patient could not tolerate pressure on legs.  Will order arterial dopplers.  Hypercholesteremia Results OK for primary prevention.  Will need to increase statin dose if PVD triggers secondary prevention.  CKD (chronic kidney disease) stage 3, GFR 30-59 ml/min Creat stable and risk factors well controled.  Encounter for screening for lung cancer Ordered screening chest CT  COPD (chronic obstructive pulmonary disease) ([place]) Likely COPD and deconditioning explains DOE.  May need further cardiac WU if pedal swelling gets worse.    Prediabetes A1c is good.     Zenia Resides, MD [place]

## 2022-05-10 NOTE — Assessment & Plan Note (Signed)
Not so much claudication but diminished pulses plus loss of hair and cold feet.  Tried to do ABIs in office.  Patient could not tolerate pressure on legs.  Will order arterial dopplers.

## 2022-05-10 NOTE — Assessment & Plan Note (Signed)
Creat stable and risk factors well controled.

## 2022-05-10 NOTE — Assessment & Plan Note (Signed)
Likely COPD and deconditioning explains DOE.  May need further cardiac WU if pedal swelling gets worse.

## 2022-05-10 NOTE — Assessment & Plan Note (Signed)
Ordered screening chest CT

## 2022-05-10 NOTE — Assessment & Plan Note (Signed)
Well controled on current meds 

## 2022-05-10 NOTE — Assessment & Plan Note (Signed)
A1c is good 

## 2022-05-10 NOTE — Assessment & Plan Note (Signed)
Ordered screening mammo.

## 2022-05-10 NOTE — Assessment & Plan Note (Signed)
Results OK for primary prevention.  Will need to increase statin dose if PVD triggers secondary prevention.

## 2022-05-10 NOTE — Assessment & Plan Note (Signed)
Generally healthy with several problems that will need FU. Recommended FU visit this fall for possible flu, RSV and Covid booster.

## 2022-05-11 ENCOUNTER — Other Ambulatory Visit: Payer: Self-pay | Admitting: Family Medicine

## 2022-05-11 DIAGNOSIS — I739 Peripheral vascular disease, unspecified: Secondary | ICD-10-CM

## 2022-05-12 ENCOUNTER — Telehealth: Payer: Self-pay | Admitting: *Deleted

## 2022-05-12 DIAGNOSIS — M1611 Unilateral primary osteoarthritis, right hip: Secondary | ICD-10-CM

## 2022-05-12 MED ORDER — DICLOFENAC SODIUM 1 % EX GEL: 2.0000 g | Freq: Four times a day (QID) | CUTANEOUS | 3 refills | Status: DC

## 2022-05-12 NOTE — Telephone Encounter (Signed)
Rx resent.

## 2022-05-12 NOTE — Telephone Encounter (Signed)
Fax received from pharmacy in reference to Diclofenac Rx.  See below for information they are requesting. Andrea Robles, CMA   Please clarify the [place] for DICLOFENAC SODIUM 1% It is available as: GEL Please clarify the GRAMS per day.

## 2022-05-16 ENCOUNTER — Telehealth: Payer: Self-pay | Admitting: *Deleted

## 2022-05-16 NOTE — Telephone Encounter (Signed)
LVM for pt to call office to be sure she had her appointment times for her Doppler and CT.    Doppler is scheduled 05/27/22 at [facility] on [facility].   Address: [address]  Time:2:00pm    CT: [facility] [address] [place]'@1'$ :20pm on 06/06/22.  Husbands CT same place/date @ 1:40pm.    Hollyanne Schloesser Katharina Caper, CMA

## 2022-05-17 ENCOUNTER — Other Ambulatory Visit (HOSPITAL_COMMUNITY): Payer: Self-pay | Admitting: Family Medicine

## 2022-05-17 DIAGNOSIS — I739 Peripheral vascular disease, unspecified: Secondary | ICD-10-CM

## 2022-05-23 ENCOUNTER — Ambulatory Visit: Payer: Medicare Other | Admitting: Orthopaedic Surgery

## 2022-05-25 ENCOUNTER — Ambulatory Visit (INDEPENDENT_AMBULATORY_CARE_PROVIDER_SITE_OTHER): Payer: Medicare Other

## 2022-05-25 ENCOUNTER — Ambulatory Visit: Payer: Medicare Other | Admitting: Orthopaedic Surgery

## 2022-05-25 DIAGNOSIS — M5441 Lumbago with sciatica, right side: Secondary | ICD-10-CM

## 2022-05-25 DIAGNOSIS — M25551 Pain in right hip: Secondary | ICD-10-CM

## 2022-05-25 DIAGNOSIS — G8929 Other chronic pain: Secondary | ICD-10-CM

## 2022-05-25 MED ORDER — METHOCARBAMOL 750 MG PO TABS: 750.0000 mg | ORAL_TABLET | Freq: Three times a day (TID) | ORAL | 1 refills | Status: DC | PRN

## 2022-05-25 MED ORDER — CELECOXIB 200 MG PO CAPS: 200.0000 mg | ORAL_CAPSULE | Freq: Two times a day (BID) | ORAL | 1 refills | Status: DC | PRN

## 2022-05-25 NOTE — Progress Notes (Signed)
The patient is sent to me from Dr. Andria Frames to evaluate and treat right hip pain.  She had old x-rays in the past and was told she had right hip arthritis.  She has been having worsening pain but on my visit today she is only complaining of pain in the lower aspect of her back to the right side.  She denies any groin pain.  There are x-rays from her lumbar spine in 2018 for me to review as well as hip x-rays from 2020 but we did obtain new hip x-rays today.  On exam her right hip moves smoothly and fluidly with no blocks to rotation and no pain at all in the groin or pain around that right hip at all.  She has pain with flexion and extension of her lumbar spine with significant pain in the lower facet joints to the right side.  X-rays today in our office show a well-maintained hip joint on the right side in the left side with no significant arthritic findings.  There are no significant osteophytes either.  Plain films of the lumbar spine from 2018 showed significant arthritic changes in the posterior elements of the lower lumbar spine with facet joint arthritic changes.  At this point I would obtain an MRI of her lumbar spine which will need to be an open MRI given her claustrophobia.  This will be to assess the facet joints and to rule out any nerve compression because she is getting a little bit of pain going down her thigh.  She does have a positive straight leg raise on the right side but again her right hip exam is entirely normal.  I will send in some Robaxin as a muscle relaxant and Celebrex as an anti-inflammatory and pain medicine.  We will see her back once we have MRI of her lumbar spine.  She agrees with this treatment plan.  All questions and concerns were answered and addressed.

## 2022-05-26 ENCOUNTER — Other Ambulatory Visit: Payer: Self-pay

## 2022-05-26 ENCOUNTER — Ambulatory Visit
Admission: RE | Admit: 2022-05-26 | Discharge: 2022-05-26 | Disposition: A | Discharge status: Home / Self Care | Payer: Medicare Other | Source: Ambulatory Visit | Attending: Family Medicine | Admitting: Family Medicine

## 2022-05-26 DIAGNOSIS — Z1231 Encounter for screening mammogram for malignant neoplasm of breast: Secondary | ICD-10-CM | POA: Diagnosis not present

## 2022-05-26 DIAGNOSIS — G8929 Other chronic pain: Secondary | ICD-10-CM

## 2022-05-27 ENCOUNTER — Ambulatory Visit (HOSPITAL_COMMUNITY)
Admission: RE | Admit: 2022-05-27 | Discharge: 2022-05-27 | Disposition: A | Discharge status: Home / Self Care | Payer: Medicare Other | Source: Ambulatory Visit | Attending: Cardiology | Admitting: Cardiology

## 2022-05-27 DIAGNOSIS — I739 Peripheral vascular disease, unspecified: Secondary | ICD-10-CM

## 2022-05-30 ENCOUNTER — Other Ambulatory Visit: Payer: Self-pay

## 2022-05-30 ENCOUNTER — Telehealth: Payer: Self-pay | Admitting: Family Medicine

## 2022-05-30 ENCOUNTER — Observation Stay (HOSPITAL_COMMUNITY)
Admission: EM | Admit: 2022-05-30 | Discharge: 2022-06-01 | Disposition: A | Discharge status: Home / Self Care | Payer: Medicare Other | Attending: Family Medicine | Admitting: Family Medicine

## 2022-05-30 ENCOUNTER — Encounter (HOSPITAL_COMMUNITY): Payer: Self-pay

## 2022-05-30 ENCOUNTER — Emergency Department (HOSPITAL_COMMUNITY): Payer: Medicare Other

## 2022-05-30 DIAGNOSIS — Z79899 Other long term (current) drug therapy: Secondary | ICD-10-CM | POA: Insufficient documentation

## 2022-05-30 DIAGNOSIS — Z20822 Contact with and (suspected) exposure to covid-19: Secondary | ICD-10-CM | POA: Insufficient documentation

## 2022-05-30 DIAGNOSIS — R0600 Dyspnea, unspecified: Principal | ICD-10-CM | POA: Insufficient documentation

## 2022-05-30 DIAGNOSIS — N183 Chronic kidney disease, stage 3 unspecified: Secondary | ICD-10-CM | POA: Diagnosis not present

## 2022-05-30 DIAGNOSIS — Z86010 Personal history of colonic polyps: Secondary | ICD-10-CM | POA: Diagnosis not present

## 2022-05-30 DIAGNOSIS — R42 Dizziness and giddiness: Secondary | ICD-10-CM | POA: Diagnosis not present

## 2022-05-30 DIAGNOSIS — R0602 Shortness of breath: Secondary | ICD-10-CM | POA: Diagnosis not present

## 2022-05-30 DIAGNOSIS — J439 Emphysema, unspecified: Secondary | ICD-10-CM | POA: Diagnosis not present

## 2022-05-30 DIAGNOSIS — I214 Non-ST elevation (NSTEMI) myocardial infarction: Secondary | ICD-10-CM

## 2022-05-30 DIAGNOSIS — R6889 Other general symptoms and signs: Secondary | ICD-10-CM | POA: Diagnosis not present

## 2022-05-30 DIAGNOSIS — R778 Other specified abnormalities of plasma proteins: Secondary | ICD-10-CM | POA: Diagnosis not present

## 2022-05-30 DIAGNOSIS — Z87891 Personal history of nicotine dependence: Secondary | ICD-10-CM | POA: Diagnosis not present

## 2022-05-30 DIAGNOSIS — R531 Weakness: Secondary | ICD-10-CM | POA: Insufficient documentation

## 2022-05-30 DIAGNOSIS — I129 Hypertensive chronic kidney disease with stage 1 through stage 4 chronic kidney disease, or unspecified chronic kidney disease: Secondary | ICD-10-CM | POA: Insufficient documentation

## 2022-05-30 DIAGNOSIS — I2510 Atherosclerotic heart disease of native coronary artery without angina pectoris: Secondary | ICD-10-CM | POA: Insufficient documentation

## 2022-05-30 DIAGNOSIS — I700 Atherosclerosis of aorta: Secondary | ICD-10-CM | POA: Diagnosis not present

## 2022-05-30 DIAGNOSIS — E041 Nontoxic single thyroid nodule: Secondary | ICD-10-CM | POA: Insufficient documentation

## 2022-05-30 DIAGNOSIS — Z743 Need for continuous supervision: Secondary | ICD-10-CM | POA: Diagnosis not present

## 2022-05-30 DIAGNOSIS — I739 Peripheral vascular disease, unspecified: Secondary | ICD-10-CM

## 2022-05-30 DIAGNOSIS — J449 Chronic obstructive pulmonary disease, unspecified: Secondary | ICD-10-CM | POA: Diagnosis not present

## 2022-05-30 DIAGNOSIS — N179 Acute kidney failure, unspecified: Secondary | ICD-10-CM

## 2022-05-30 DIAGNOSIS — R911 Solitary pulmonary nodule: Secondary | ICD-10-CM | POA: Diagnosis not present

## 2022-05-30 DIAGNOSIS — I499 Cardiac arrhythmia, unspecified: Secondary | ICD-10-CM | POA: Diagnosis not present

## 2022-05-30 DIAGNOSIS — R404 Transient alteration of awareness: Secondary | ICD-10-CM | POA: Diagnosis not present

## 2022-05-30 DIAGNOSIS — E785 Hyperlipidemia, unspecified: Secondary | ICD-10-CM

## 2022-05-30 DIAGNOSIS — I10 Essential (primary) hypertension: Secondary | ICD-10-CM

## 2022-05-30 LAB — I-STAT CHEM 8, ED
BUN: 22 mg/dL (ref 8–23)
Calcium, Ion: 1.19 mmol/L (ref 1.15–1.40)
Chloride: 106 mmol/L (ref 98–111)
Creatinine, Ser: 0.9 mg/dL (ref 0.44–1.00)
Glucose, Bld: 154 mg/dL — ABNORMAL HIGH (ref 70–99)
HCT: 42 % (ref 36.0–46.0)
Hemoglobin: 14.3 g/dL (ref 12.0–15.0)
Potassium: 3.7 mmol/L (ref 3.5–5.1)
Sodium: 143 mmol/L (ref 135–145)
TCO2: 25 mmol/L (ref 22–32)

## 2022-05-30 LAB — I-STAT VENOUS BLOOD GAS, ED
Acid-base deficit: 3 mmol/L — ABNORMAL HIGH (ref 0.0–2.0)
Bicarbonate: 24.6 mmol/L (ref 20.0–28.0)
Calcium, Ion: 1.18 mmol/L (ref 1.15–1.40)
HCT: 42 % (ref 36.0–46.0)
Hemoglobin: 14.3 g/dL (ref 12.0–15.0)
O2 Saturation: 77 %
Potassium: 3.7 mmol/L (ref 3.5–5.1)
Sodium: 142 mmol/L (ref 135–145)
TCO2: 26 mmol/L (ref 22–32)
pCO2, Ven: 54.2 mmHg (ref 44–60)
pH, Ven: 7.264 (ref 7.25–7.43)
pO2, Ven: 48 mmHg — ABNORMAL HIGH (ref 32–45)

## 2022-05-30 MED ORDER — IPRATROPIUM-ALBUTEROL 0.5-2.5 (3) MG/3ML IN SOLN
3.0000 mL | Freq: Once | RESPIRATORY_TRACT | Status: AC
  Administered 2022-05-31: 3 mL via RESPIRATORY_TRACT
  Filled 2022-05-30: qty 3

## 2022-05-30 NOTE — ED Triage Notes (Signed)
Pt BIB EMS from home. Pt was laying in bed when she had sudden onset of weakness and malaise. Pt had 1 episode of loose bowel mvmt and chills + dizziness with standing. Pt also reports bilat calf pain - pt had doppler study on 8/4 and was told the pressure in her calves were uneven.  Pt denies CP, endorses SOB.  VS with EMS  190/54 HR 90s - NSR w/ pauses  CBG 162

## 2022-05-30 NOTE — Telephone Encounter (Signed)
Now with severe bilateral calf pain.  ABI shows mild PVD on Left leg. Ortho thinks right hip pain is from spine, not hip. Patient wonders if celebrex causing pain - it is her only new med. Patient will stop celebrex today If still with calf pain, stop lipitor in 3 days. Call me next week.   I will order left leg arterial dopplers.

## 2022-05-30 NOTE — ED Provider Notes (Signed)
[facility] Provider Note  CSN: [id number] Arrival date & time: 05/30/22 2309  Chief Complaint(s) Weakness, Fatigue, and Shortness of Breath  HPI Andrea Robles is a 71 y.o. female {Add pertinent medical, surgical, social history, OB history to HPI:1}   The history is provided by the patient.  Shortness of Breath Severity:  Moderate Onset quality:  Sudden Duration:  2 hours Timing:  Constant Progression:  Waxing and waning Chronicity:  New Context comment:  At rest Relieved by:  Nothing Worsened by:  Activity Associated symptoms: diaphoresis   Associated symptoms: no abdominal pain, no chest pain, no claudication, no cough, no fever, no headaches, no hemoptysis, no rash, no vomiting and no wheezing   Associated symptoms comment:  Felt lightheaded Risk factors: obesity   Risk factors: no hx of PE/DVT, no prolonged immobilization and no tobacco use     Past Medical History Past Medical History:  Diagnosis Date  . Allergy   . Hypercholesteremia   . Hypertension    Patient Active Problem List   Diagnosis Date Noted  . Breast cancer screening 05/10/2022  . Intermittent claudication ([place]) 05/09/2022  . Sinusitis 09/27/2021  . Hirsutism 07/15/2021  . Bunion of great toe of left foot 03/18/2021  . Acute gout 02/08/2021  . Encounter for screening for lung cancer 01/01/2020  . CKD (chronic kidney disease) stage 3, GFR 30-59 ml/min (HCC) 01/01/2020  . Actinic keratosis 06/08/2017  . Osteoarthritis of right hip 11/03/2016  . Midline low back pain without sciatica 10/05/2012  . Hypercholesteremia 04/16/2012  . Prediabetes 04/11/2012  . Routine general medical examination at a health care facility 05/13/2011  . COPD (chronic obstructive pulmonary disease) ([place]) 06/27/2008  . CERVICAL RADICULOPATHY 10/19/2007  . Quit smoking 12/21/2006  . HYPERTENSION, BENIGN SYSTEMIC 12/21/2006  . Hx of adenomatous polyp of colon 12/21/2006   Home  Medication(s) Prior to Admission medications   Medication Sig Start Date End Date Taking? Authorizing Provider  amoxicillin-clavulanate (AUGMENTIN) 875-125 MG tablet Take 1 tablet by mouth 2 (two) times daily. 09/27/21   Zenia Resides, MD  atorvastatin (LIPITOR) 20 MG tablet Take 1 tablet (20 mg total) by mouth daily. 12/20/21   Zenia Resides, MD  benzonatate (TESSALON) 100 MG capsule Take 1 capsule (100 mg total) by mouth every 8 (eight) hours as needed for cough. 09/15/21   Teodora Medici, FNP  celecoxib (CELEBREX) 200 MG capsule Take 1 capsule (200 mg total) by mouth 2 (two) times daily between meals as needed. 05/25/22   Mcarthur Rossetti, MD  cetirizine (ZYRTEC) 10 MG tablet Take 10 mg by mouth daily.    [provider]  diclofenac Sodium (VOLTAREN) 1 % GEL Apply 2 g topically 4 (four) times daily. 05/12/22   Zenia Resides, MD  Eflornithine HCl 13.9 % cream Apply twice daily to remove hair. 07/15/21   Zenia Resides, MD  hydrOXYzine (ATARAX/VISTARIL) 10 MG tablet Take 1 tablet (10 mg total) by mouth every 6 (six) hours. 04/16/21   Pearson Forster, NP  LORazepam (ATIVAN) 0.5 MG tablet Take 1 tablet by mouth twice daily as needed for anxiety 08/12/20   Zenia Resides, MD  losartan (COZAAR) 100 MG tablet Take 1 tablet (100 mg total) by mouth at bedtime. 12/20/21   Zenia Resides, MD  methocarbamol (ROBAXIN) 750 MG tablet Take 1 tablet (750 mg total) by mouth every 8 (eight) hours as needed for muscle spasms. 05/25/22   Mcarthur Rossetti,  MD  metoprolol tartrate (LOPRESSOR) 25 MG tablet Take 1 tablet (25 mg total) by mouth 2 (two) times daily. 12/20/21   Zenia Resides, MD                                                                                                                                    Allergies Statins, Colchicine, Hydrocodone, Famotidine, Flexeril [cyclobenzaprine], Metronidazole, and Nortriptyline  Review of Systems Review of Systems   Constitutional:  Positive for diaphoresis. Negative for fever.  Respiratory:  Positive for shortness of breath. Negative for cough, hemoptysis and wheezing.   Cardiovascular:  Negative for chest pain and claudication.  Gastrointestinal:  Negative for abdominal pain and vomiting.  Skin:  Negative for rash.  Neurological:  Negative for headaches.   As noted in HPI  Physical Exam Vital Signs  I have reviewed the triage vital signs BP (!) 186/91   Pulse 81   Temp 98.2 F (36.8 C) (Oral)   Resp 14   Ht '5\' 9"'$  (1.753 m)   Wt 92.5 kg   SpO2 100%   BMI 30.13 kg/m   Physical Exam Vitals reviewed.  Constitutional:      General: She is not in acute distress.    Appearance: She is well-developed. She is not diaphoretic.  HENT:     Head: Normocephalic and atraumatic.     Nose: Nose normal.  Eyes:     General: No scleral icterus.       Right eye: No discharge.        Left eye: No discharge.     Conjunctiva/sclera: Conjunctivae normal.     Pupils: Pupils are equal, round, and reactive to light.  Neck:     Vascular: No hepatojugular reflux or JVD.  Cardiovascular:     Rate and Rhythm: Normal rate and regular rhythm.     Heart sounds: No murmur heard.    No friction rub. No gallop.  Pulmonary:     Effort: Pulmonary effort is normal. No respiratory distress.     Breath sounds: No stridor. Examination of the left-lower field reveals decreased breath sounds. Decreased breath sounds present. No wheezing, rhonchi or rales.     Comments: Pursed lip breathing Abdominal:     General: There is no distension.     Palpations: Abdomen is soft.     Tenderness: There is no abdominal tenderness.  Musculoskeletal:        General: No tenderness.     Cervical back: Normal range of motion and neck supple.  Skin:    General: Skin is warm and dry.     Findings: No erythema or rash.  Neurological:     Mental Status: She is alert and oriented to person, place, and time.     ED Results and  Treatments Labs (all labs ordered are listed, but only abnormal results are displayed) Labs Reviewed - No data to display  EKG  EKG Interpretation  Date/Time:    Ventricular Rate:    PR Interval:    QRS Duration:   QT Interval:    QTC Calculation:   R Axis:     Text Interpretation:         Radiology No results found.  Medications Ordered in ED Medications - No data to display                                                                                                                                   Procedures Procedures  (including critical care time)  Medical Decision Making / ED Course   Medical Decision Making Amount and/or Complexity of Data Reviewed Labs: ordered. Decision-making details documented in ED Course. Radiology: ordered and independent interpretation performed. Decision-making details documented in ED Course. ECG/medicine tests: ordered and independent interpretation performed. Decision-making details documented in ED Course.  Risk Prescription drug management. Decision regarding hospitalization.     Clinical Course as of 05/30/22 2334  Mon May 30, 2022  2333 SBO -Differential includes pneumonia, pneumothorax, heart failure, PE, pleural effusion.  Will also assess for anemia.  Will check for any electrolyte/metabolic derangements and renal insufficiency. -Will keep a monitor to assess for any persistent dysrhythmias which may be contributing to her shortness of breath. [PC]    Clinical Course User Index [PC] Elmore Hyslop, Grayce Sessions, MD      Final Clinical Impression(s) / ED Diagnoses Final diagnoses:  None    {Document critical care time when appropriate:1}  {Document review of labs and clinical decision tools ie heart score, Chads2Vasc2 etc:1}  {Document your independent review of radiology images, and any outside  records:1} {Document your discussion with family members, caretakers, and with consultants:1} {Document social determinants of health affecting pt's care:1} {Document your decision making why or why not admission, treatments were needed:1} This chart was dictated using voice recognition software.  Despite best efforts to proofread,  errors can occur which can change the documentation meaning.

## 2022-05-31 ENCOUNTER — Emergency Department (HOSPITAL_COMMUNITY): Payer: Medicare Other

## 2022-05-31 ENCOUNTER — Encounter (HOSPITAL_COMMUNITY)
Admission: EM | Disposition: A | Discharge status: Home / Self Care | Payer: Self-pay | Source: Home / Self Care | Attending: Emergency Medicine

## 2022-05-31 ENCOUNTER — Emergency Department (HOSPITAL_BASED_OUTPATIENT_CLINIC_OR_DEPARTMENT_OTHER): Payer: Medicare Other

## 2022-05-31 ENCOUNTER — Encounter (HOSPITAL_COMMUNITY): Payer: Self-pay

## 2022-05-31 DIAGNOSIS — R911 Solitary pulmonary nodule: Secondary | ICD-10-CM

## 2022-05-31 DIAGNOSIS — I10 Essential (primary) hypertension: Secondary | ICD-10-CM

## 2022-05-31 DIAGNOSIS — I214 Non-ST elevation (NSTEMI) myocardial infarction: Secondary | ICD-10-CM | POA: Diagnosis not present

## 2022-05-31 DIAGNOSIS — R778 Other specified abnormalities of plasma proteins: Secondary | ICD-10-CM

## 2022-05-31 DIAGNOSIS — E041 Nontoxic single thyroid nodule: Secondary | ICD-10-CM

## 2022-05-31 DIAGNOSIS — E785 Hyperlipidemia, unspecified: Secondary | ICD-10-CM

## 2022-05-31 DIAGNOSIS — R531 Weakness: Secondary | ICD-10-CM | POA: Diagnosis not present

## 2022-05-31 DIAGNOSIS — R002 Palpitations: Secondary | ICD-10-CM | POA: Diagnosis not present

## 2022-05-31 DIAGNOSIS — R0609 Other forms of dyspnea: Secondary | ICD-10-CM

## 2022-05-31 DIAGNOSIS — R0602 Shortness of breath: Secondary | ICD-10-CM

## 2022-05-31 DIAGNOSIS — R0600 Dyspnea, unspecified: Secondary | ICD-10-CM | POA: Diagnosis not present

## 2022-05-31 DIAGNOSIS — R42 Dizziness and giddiness: Secondary | ICD-10-CM | POA: Diagnosis not present

## 2022-05-31 DIAGNOSIS — J439 Emphysema, unspecified: Secondary | ICD-10-CM | POA: Diagnosis not present

## 2022-05-31 HISTORY — PX: LEFT HEART CATH AND CORONARY ANGIOGRAPHY: CATH118249

## 2022-05-31 LAB — CREATININE, SERUM
Creatinine, Ser: 1.1 mg/dL — ABNORMAL HIGH (ref 0.44–1.00)
GFR, Estimated: 54 mL/min — ABNORMAL LOW (ref >60)

## 2022-05-31 LAB — CBC
HCT: 42.2 % (ref 36.0–46.0)
Hemoglobin: 13.9 g/dL (ref 12.0–15.0)
MCH: 29.7 pg (ref 26.0–34.0)
MCHC: 32.9 g/dL (ref 30.0–36.0)
MCV: 90.2 fL (ref 80.0–100.0)
Platelets: 238 10*3/uL (ref 150–400)
RBC: 4.68 MIL/uL (ref 3.87–5.11)
RDW: 13.4 % (ref 11.5–15.5)
WBC: 8.4 10*3/uL (ref 4.0–10.5)
nRBC: 0 % (ref 0.0–0.2)

## 2022-05-31 LAB — CBC WITH DIFFERENTIAL/PLATELET
Abs Immature Granulocytes: 0.02 10*3/uL (ref 0.00–0.07)
Basophils Absolute: 0.1 10*3/uL (ref 0.0–0.1)
Basophils Relative: 1 %
Eosinophils Absolute: 0.3 10*3/uL (ref 0.0–0.5)
Eosinophils Relative: 4 %
HCT: 43.4 % (ref 36.0–46.0)
Hemoglobin: 14 g/dL (ref 12.0–15.0)
Immature Granulocytes: 0 %
Lymphocytes Relative: 32 %
Lymphs Abs: 2.5 10*3/uL (ref 0.7–4.0)
MCH: 29.7 pg (ref 26.0–34.0)
MCHC: 32.3 g/dL (ref 30.0–36.0)
MCV: 92.1 fL (ref 80.0–100.0)
Monocytes Absolute: 0.5 10*3/uL (ref 0.1–1.0)
Monocytes Relative: 7 %
Neutro Abs: 4.3 10*3/uL (ref 1.7–7.7)
Neutrophils Relative %: 56 %
Platelets: 227 10*3/uL (ref 150–400)
RBC: 4.71 MIL/uL (ref 3.87–5.11)
RDW: 13.2 % (ref 11.5–15.5)
WBC: 7.6 10*3/uL (ref 4.0–10.5)
nRBC: 0 % (ref 0.0–0.2)

## 2022-05-31 LAB — COMPREHENSIVE METABOLIC PANEL
ALT: 15 U/L (ref 0–44)
AST: 21 U/L (ref 15–41)
Albumin: 3.7 g/dL (ref 3.5–5.0)
Alkaline Phosphatase: 78 U/L (ref 38–126)
Anion gap: 7 (ref 5–15)
BUN: 20 mg/dL (ref 8–23)
CO2: 25 mmol/L (ref 22–32)
Calcium: 9 mg/dL (ref 8.9–10.3)
Chloride: 107 mmol/L (ref 98–111)
Creatinine, Ser: 1.08 mg/dL — ABNORMAL HIGH (ref 0.44–1.00)
GFR, Estimated: 55 mL/min — ABNORMAL LOW (ref >60)
Glucose, Bld: 169 mg/dL — ABNORMAL HIGH (ref 70–99)
Potassium: 3.8 mmol/L (ref 3.5–5.1)
Sodium: 139 mmol/L (ref 135–145)
Total Bilirubin: 0.7 mg/dL (ref 0.3–1.2)
Total Protein: 7.1 g/dL (ref 6.5–8.1)

## 2022-05-31 LAB — RESP PANEL BY RT-PCR (FLU A&B, COVID) ARPGX2
Influenza A by PCR: NEGATIVE
Influenza B by PCR: NEGATIVE
SARS Coronavirus 2 by RT PCR: NEGATIVE

## 2022-05-31 LAB — ECHOCARDIOGRAM COMPLETE
AR max vel: 1.95 cm2
AV Peak grad: 9.3 mmHg
Ao pk vel: 1.53 m/s
Area-P 1/2: 6.9 cm2
Height: 69 in
S' Lateral: 3.3 cm
Weight: 3264 oz

## 2022-05-31 LAB — TROPONIN I (HIGH SENSITIVITY)
Troponin I (High Sensitivity): 12 ng/L (ref <18)
Troponin I (High Sensitivity): 75 ng/L — ABNORMAL HIGH (ref <18)
Troponin I (High Sensitivity): 129 ng/L (ref <18)
Troponin I (High Sensitivity): 218 ng/L (ref <18)
Troponin I (High Sensitivity): 231 ng/L (ref <18)
Troponin I (High Sensitivity): 197 ng/L (ref <18)

## 2022-05-31 LAB — BRAIN NATRIURETIC PEPTIDE: B Natriuretic Peptide: 185.8 pg/mL — ABNORMAL HIGH (ref 0.0–100.0)

## 2022-05-31 LAB — HIV ANTIBODY (ROUTINE TESTING W REFLEX): HIV Screen 4th Generation wRfx: NONREACTIVE

## 2022-05-31 LAB — D-DIMER, QUANTITATIVE: D-Dimer, Quant: 0.73 ug/mL-FEU — ABNORMAL HIGH (ref 0.00–0.50)

## 2022-05-31 SURGERY — LEFT HEART CATH AND CORONARY ANGIOGRAPHY
Anesthesia: LOCAL

## 2022-05-31 MED ORDER — SODIUM CHLORIDE 0.9 % IV SOLN: INTRAVENOUS | Status: AC

## 2022-05-31 MED ORDER — ASPIRIN 81 MG PO CHEW
81.0000 mg | CHEWABLE_TABLET | Freq: Every day | ORAL | Status: DC
Start: 2022-05-31 — End: 2022-06-01
  Administered 2022-06-01: 81 mg via ORAL
  Filled 2022-05-31: qty 1

## 2022-05-31 MED ORDER — HEPARIN SODIUM (PORCINE) 1000 UNIT/ML IJ SOLN
INTRAMUSCULAR | Status: DC | PRN
  Administered 2022-05-31: 4500 [IU] via INTRAVENOUS

## 2022-05-31 MED ORDER — SODIUM CHLORIDE 0.9 % IV SOLN: 250.0000 mL | INTRAVENOUS | Status: DC | PRN

## 2022-05-31 MED ORDER — FENTANYL CITRATE (PF) 100 MCG/2ML IJ SOLN
INTRAMUSCULAR | Status: DC | PRN
  Administered 2022-05-31: 25 ug via INTRAVENOUS

## 2022-05-31 MED ORDER — HEPARIN SODIUM (PORCINE) 1000 UNIT/ML IJ SOLN
INTRAMUSCULAR | Status: AC
Start: 2022-05-31 — End: ?
  Filled 2022-05-31: qty 10

## 2022-05-31 MED ORDER — SODIUM CHLORIDE 0.9% FLUSH: 3.0000 mL | INTRAVENOUS | Status: DC | PRN

## 2022-05-31 MED ORDER — ASPIRIN 81 MG PO CHEW
324.0000 mg | CHEWABLE_TABLET | Freq: Once | ORAL | Status: AC
  Administered 2022-05-31: 324 mg via ORAL
  Filled 2022-05-31: qty 4

## 2022-05-31 MED ORDER — LIDOCAINE HCL (PF) 1 % IJ SOLN
INTRAMUSCULAR | Status: AC
Start: 2022-05-31 — End: ?
  Filled 2022-05-31: qty 30

## 2022-05-31 MED ORDER — SODIUM CHLORIDE 0.9 % WEIGHT BASED INFUSION
3.0000 mL/kg/h | INTRAVENOUS | Status: DC
  Administered 2022-05-31: 3 mL/kg/h via INTRAVENOUS

## 2022-05-31 MED ORDER — SODIUM CHLORIDE 0.9 % WEIGHT BASED INFUSION
1.0000 mL/kg/h | INTRAVENOUS | Status: DC
  Administered 2022-05-31: 1 mL/kg/h via INTRAVENOUS

## 2022-05-31 MED ORDER — OXYCODONE HCL 5 MG PO TABS: 5.0000 mg | ORAL_TABLET | ORAL | Status: DC | PRN

## 2022-05-31 MED ORDER — ACETAMINOPHEN 325 MG PO TABS
650.0000 mg | ORAL_TABLET | ORAL | Status: DC | PRN
  Administered 2022-05-31: 650 mg via ORAL
  Filled 2022-05-31: qty 2

## 2022-05-31 MED ORDER — HYDRALAZINE HCL 20 MG/ML IJ SOLN: 10.0000 mg | INTRAMUSCULAR | Status: AC | PRN

## 2022-05-31 MED ORDER — FENTANYL CITRATE (PF) 100 MCG/2ML IJ SOLN
INTRAMUSCULAR | Status: AC
  Filled 2022-05-31: qty 2

## 2022-05-31 MED ORDER — VERAPAMIL HCL 2.5 MG/ML IV SOLN
INTRAVENOUS | Status: AC
Start: 2022-05-31 — End: ?
  Filled 2022-05-31: qty 2

## 2022-05-31 MED ORDER — HEPARIN (PORCINE) IN NACL 1000-0.9 UT/500ML-% IV SOLN
INTRAVENOUS | Status: DC | PRN
  Administered 2022-05-31 (×4): 500 mL

## 2022-05-31 MED ORDER — ATORVASTATIN CALCIUM 10 MG PO TABS
20.0000 mg | ORAL_TABLET | Freq: Every day | ORAL | Status: DC
  Administered 2022-05-31 – 2022-06-01 (×2): 20 mg via ORAL
  Filled 2022-05-31 (×3): qty 2

## 2022-05-31 MED ORDER — MIDAZOLAM HCL 2 MG/2ML IJ SOLN
INTRAMUSCULAR | Status: AC
  Filled 2022-05-31: qty 2

## 2022-05-31 MED ORDER — LOSARTAN POTASSIUM 50 MG PO TABS
100.0000 mg | ORAL_TABLET | Freq: Every day | ORAL | Status: DC
Start: 2022-05-31 — End: 2022-06-01
  Administered 2022-05-31: 100 mg via ORAL
  Filled 2022-05-31: qty 2

## 2022-05-31 MED ORDER — MIDAZOLAM HCL 2 MG/2ML IJ SOLN
INTRAMUSCULAR | Status: DC | PRN
  Administered 2022-05-31: 1 mg via INTRAVENOUS

## 2022-05-31 MED ORDER — IOHEXOL 350 MG/ML SOLN
100.0000 mL | Freq: Once | INTRAVENOUS | Status: AC | PRN
  Administered 2022-05-31: 54 mL via INTRAVENOUS

## 2022-05-31 MED ORDER — LABETALOL HCL 5 MG/ML IV SOLN: 10.0000 mg | INTRAVENOUS | Status: AC | PRN

## 2022-05-31 MED ORDER — VERAPAMIL HCL 2.5 MG/ML IV SOLN
INTRAVENOUS | Status: DC | PRN
  Administered 2022-05-31: 10 mL via INTRA_ARTERIAL

## 2022-05-31 MED ORDER — HEPARIN SODIUM (PORCINE) 5000 UNIT/ML IJ SOLN
5000.0000 [IU] | Freq: Three times a day (TID) | INTRAMUSCULAR | Status: DC
  Administered 2022-05-31 – 2022-06-01 (×2): 5000 [IU] via SUBCUTANEOUS
  Filled 2022-05-31 (×2): qty 1

## 2022-05-31 MED ORDER — HEPARIN (PORCINE) IN NACL 1000-0.9 UT/500ML-% IV SOLN
INTRAVENOUS | Status: AC
  Filled 2022-05-31: qty 1000

## 2022-05-31 MED ORDER — ONDANSETRON HCL 4 MG/2ML IJ SOLN: 4.0000 mg | Freq: Four times a day (QID) | INTRAMUSCULAR | Status: DC | PRN

## 2022-05-31 MED ORDER — METOPROLOL TARTRATE 25 MG PO TABS
25.0000 mg | ORAL_TABLET | Freq: Two times a day (BID) | ORAL | Status: DC
  Administered 2022-05-31 – 2022-06-01 (×3): 25 mg via ORAL
  Filled 2022-05-31 (×3): qty 1

## 2022-05-31 MED ORDER — SODIUM CHLORIDE 0.9% FLUSH
3.0000 mL | Freq: Two times a day (BID) | INTRAVENOUS | Status: DC
  Administered 2022-05-31: 3 mL via INTRAVENOUS

## 2022-05-31 MED ORDER — LIDOCAINE HCL (PF) 1 % IJ SOLN
INTRAMUSCULAR | Status: DC | PRN
  Administered 2022-05-31: 2 mL

## 2022-05-31 MED ORDER — ENOXAPARIN SODIUM 40 MG/0.4ML IJ SOSY
40.0000 mg | PREFILLED_SYRINGE | INTRAMUSCULAR | Status: DC
  Administered 2022-05-31: 40 mg via SUBCUTANEOUS
  Filled 2022-05-31: qty 0.4

## 2022-05-31 MED ORDER — IOHEXOL 350 MG/ML SOLN
INTRAVENOUS | Status: DC | PRN
  Administered 2022-05-31: 60 mL

## 2022-05-31 MED ORDER — SODIUM CHLORIDE 0.9% FLUSH
3.0000 mL | Freq: Two times a day (BID) | INTRAVENOUS | Status: DC
  Administered 2022-05-31 – 2022-06-01 (×2): 3 mL via INTRAVENOUS

## 2022-05-31 SURGICAL SUPPLY — 11 items
BAND ZEPHYR COMPRESS 30 LONG (HEMOSTASIS) ×1 IMPLANT
CATH 5FR JL3.5 JR4 ANG PIG MP (CATHETERS) ×1 IMPLANT
GLIDESHEATH SLEND A-KIT 6F 22G (SHEATH) ×1 IMPLANT
GUIDEWIRE INQWIRE 1.5J.035X260 (WIRE) IMPLANT
INQWIRE 1.5J .035X260CM (WIRE) ×2
KIT HEART LEFT (KITS) ×2 IMPLANT
PACK CARDIAC CATHETERIZATION (CUSTOM PROCEDURE TRAY) ×2 IMPLANT
SHEATH PROBE COVER 6X72 (BAG) ×1 IMPLANT
TRANSDUCER W/STOPCOCK (MISCELLANEOUS) ×2 IMPLANT
TUBING CIL FLEX 10 FLL-RA (TUBING) ×2 IMPLANT
WIRE HI TORQ VERSACORE-J 145CM (WIRE) ×1 IMPLANT

## 2022-05-31 NOTE — ED Notes (Signed)
Pt off to Cath Lab.

## 2022-05-31 NOTE — ED Notes (Signed)
MD notified of critical troponin.

## 2022-05-31 NOTE — Assessment & Plan Note (Addendum)
Recent vascular US done on 8/4 showing mild LLE arterial disease. -Continue home Lipitor - follow up outpatient with LLE doppler that is ordered already.

## 2022-05-31 NOTE — Hospital Course (Addendum)
Andrea Robles is a 71 y.o. female who was admitted to the [facility] Teaching Service at [facility] for worsening Dyspnea and ACS rule out. Hospital course is outlined below by system.    Dyspnea  Patient p/w chief complaint of worsening dyspnea and elevated BP with dizziness. Her intially work up include chest CT that was negative for PE and CXR showed no concern for PNA. EKG showed NSR without ST changes, however had an uptrending Troponin as high as 231. Echo was obtained which showed EF of 60 to 65% with grade 1 diastolic dysfunction of the LV.  Cardiology consulted who performed Heart cath which ruled out CAD. Given her complaint of palpitation prior to admission and PACs on telemetry, cardiology recommend [place] monitoring outpatient with close follow up.   Lung nodule 63m subpleural pulmonary nodules in RLL noted on CTA. Will need Repeat non-con chest CT in 1 yr   Thyroid nodule 121mthyroid nodule noted on CTA in left thyroid gland. Most recent TSH 7/17 wnl. Will need thyroid [place]  PCP follow-up recommendations Given patient's complaint of palpitations prior to admission and cardiac monitoring showing PACs, patient to have outpatient Zio monitor per cards with follow up scheduled for 06/13/22 Given dyspnea and remote history of tobacco use patient will need out patient PFT CT chest show 5 mm subpleural Nodule, will need follow up non-contrast chest CT in 12 months Found to have 16 mm nodule on the left thyroid gland. Will need outpatient thyroid [place]

## 2022-05-31 NOTE — Assessment & Plan Note (Signed)
37m subpleural pulmonary nodules in RLL noted on CTA.  -Repeat non-con chest CT in 1 yr

## 2022-05-31 NOTE — ED Notes (Signed)
Admitting MDs at bedside.

## 2022-05-31 NOTE — Interval H&P Note (Signed)
Cath Lab Visit (complete for each Cath Lab visit)  Clinical Evaluation Leading to the Procedure:   ACS: Yes.    Non-ACS:    Anginal Classification: CCS III  Anti-ischemic medical therapy: No Therapy  Non-Invasive Test Results: No non-invasive testing performed  Prior CABG: No previous CABG      History and Physical Interval Note:  05/31/2022 5:05 PM  Andrea Robles  has presented today for surgery, with the diagnosis of nstemi.  The various methods of treatment have been discussed with the patient and family. After consideration of risks, benefits and other options for treatment, the patient has consented to  Procedure(s): LEFT HEART CATH AND CORONARY ANGIOGRAPHY (N/A) as a surgical intervention.  The patient's history has been reviewed, patient examined, no change in status, stable for surgery.  I have reviewed the patient's chart and labs.  Questions were answered to the patient's satisfaction.     Belva Crome III

## 2022-05-31 NOTE — Assessment & Plan Note (Signed)
>>  ASSESSMENT AND PLAN FOR HYPERTENSION WRITTEN ON 06/01/2022  7:47 AM BY Jerre Simon, MD  BP is improved with intermittent mild hypotension. -Continue home losartan, lopressor -Continue BP with routine vitals

## 2022-05-31 NOTE — ED Notes (Signed)
CT/ Co/flu/ neb

## 2022-05-31 NOTE — Plan of Care (Signed)
  Problem: Education: Goal: Understanding of CV disease, CV risk reduction, and recovery process will improve Outcome: Progressing   Problem: Cardiovascular: Goal: Ability to achieve and maintain adequate cardiovascular perfusion will improve Outcome: Progressing Goal: Vascular access site(s) Level 0-1 will be maintained Outcome: Progressing   Problem: Health Behavior/Discharge Planning: Goal: Ability to safely manage health-related needs after discharge will improve Outcome: Progressing   Problem: Pain Managment: Goal: General experience of comfort will improve Outcome: Progressing   Problem: Safety: Goal: Ability to remain free from injury will improve Outcome: Progressing   Problem: Skin Integrity: Goal: Risk for impaired skin integrity will decrease Outcome: Progressing

## 2022-05-31 NOTE — ED Notes (Signed)
Delta troponin elevation reported to Dr. Leonette Monarch via secure chat

## 2022-05-31 NOTE — ED Notes (Signed)
Lab called and asked to run labs

## 2022-05-31 NOTE — CV Procedure (Signed)
Right dominant coronary anatomy. Normal left main, LAD, ramus intermedius, circumflex, and RCA. Normal LV function with normal LVEDP.

## 2022-05-31 NOTE — Assessment & Plan Note (Addendum)
Echo yesterday showed EF of 60 to 65% with grade 1 diastolic dysfunction of the LV.  Heart cath yesterday was unremarkable. Will follow up orthostatic vitals. Cardiology following, appreciate recs -Continue cardiac monitoring -Likely need outpatient monitoring -Consider outpatient neurology referral

## 2022-05-31 NOTE — Consult Note (Addendum)
Cardiology Consultation:   Patient ID: ARGUSTA MCGANN MRN: [id number]; DOB: Jul 20, 1951  Admit date: 05/30/2022 Date of Consult: 05/31/2022  PCP:  Zenia Resides, MD   [facility] Providers Cardiologist:  None        Patient Profile:   KASSIDI ELZA is a 71 y.o. female with a hx of HTN, HLD, FH CAD, seasonal allergies, who is being seen 05/31/2022 for the evaluation of chest fluttering, elevated troponin, at the request of Dr McDiarmid.  History of Present Illness:   Ms. Bridgers has never been evaluated by Cardiology before.   The heart fluttering will happen at rest and with exertion. Metoprolol used to help, but has seemed less effective recently. The episodes last <-10 sec. Sometimes she gets light-headed or dizzy with the fluttering.   She does housework, yard work, but has not been able to do what she used to because of her back. She gets more fluttering when she is walking around, more dizziness. No chest pain.  She also has problems w/ pain in her legs and feet. Sometimes toes turn purple. Feet are always cold. Sometimes has resting leg pain. A nurse made a home visit and thought she had PAD.   Last pm, she had a spell that lasted longer than usual and was associated w/ SOB. This concerned her so she came to the hospital.  In the hospital, her BP has been labile, but generally feels better. Some small flutters.    Past Medical History:  Diagnosis Date   Allergy    Hypercholesteremia    Hypertension     Past Surgical History:  Procedure Laterality Date   ABDOMINAL HYSTERECTOMY     CHOLECYSTECTOMY     COLONOSCOPY  01/19/2015   POLYPECTOMY       Home Medications:  Prior to Admission medications   Medication Sig Start Date End Date Taking? Authorizing Provider  atorvastatin (LIPITOR) 20 MG tablet Take 1 tablet (20 mg total) by mouth daily. 12/20/21   Zenia Resides, MD  celecoxib (CELEBREX) 200 MG capsule Take 1 capsule (200 mg total) by mouth 2 (two) times  daily between meals as needed. 05/25/22   Mcarthur Rossetti, MD  cetirizine (ZYRTEC) 10 MG tablet Take 10 mg by mouth daily.    [provider]  diclofenac Sodium (VOLTAREN) 1 % GEL Apply 2 g topically 4 (four) times daily. 05/12/22   Zenia Resides, MD  Eflornithine HCl 13.9 % cream Apply twice daily to remove hair. 07/15/21   Zenia Resides, MD  LORazepam (ATIVAN) 0.5 MG tablet Take 1 tablet by mouth twice daily as needed for anxiety 08/12/20   Zenia Resides, MD  losartan (COZAAR) 100 MG tablet Take 1 tablet (100 mg total) by mouth at bedtime. 12/20/21   Zenia Resides, MD  methocarbamol (ROBAXIN) 750 MG tablet Take 1 tablet (750 mg total) by mouth every 8 (eight) hours as needed for muscle spasms. 05/25/22   Mcarthur Rossetti, MD  metoprolol tartrate (LOPRESSOR) 25 MG tablet Take 1 tablet (25 mg total) by mouth 2 (two) times daily. 12/20/21   Zenia Resides, MD    Inpatient Medications: Scheduled Meds:  atorvastatin  20 mg Oral Daily   enoxaparin (LOVENOX) injection  40 mg Subcutaneous Q24H   losartan  100 mg Oral QHS   metoprolol tartrate  25 mg Oral BID   Continuous Infusions:  PRN Meds:   Allergies:    Allergies  Allergen Reactions   Statins  Other (See Comments)    Myalgias with pravastatin, simvastatin, rosuvastatin. Able to tolerate Atorvastatin   Colcrys [Colchicine] Diarrhea   Hydrocodone Other (See Comments)    Tachycardia   Flagyl [Metronidazole] Itching and Rash   Flexeril [Cyclobenzaprine] Other (See Comments)    Drowsiness    Pamelor [Nortriptyline] Other (See Comments)    Drowsiness    Pepcid [Famotidine] Itching and Rash    Social History:   Social History   Socioeconomic History   Marital status: Married    Spouse name: Not on file   Number of children: Not on file   Years of education: Not on file   Highest education level: Not on file  Occupational History   Not on file  Tobacco Use   Smoking status: Former     Packs/day: 0.50    Years: 44.00    Total pack years: 22.00    Types: Cigarettes    Quit date: 03/27/2012    Years since quitting: 10.1   Smokeless tobacco: Never  Vaping Use   Vaping Use: Never used  Substance and Sexual Activity   Alcohol use: Yes    Comment: ocassionally-rare   Drug use: No   Sexual activity: Never  Other Topics Concern   Not on file  Social History Narrative   Not on file   Social Determinants of Health   Financial Resource Strain: Not on file  Food Insecurity: Not on file  Transportation Needs: Not on file  Physical Activity: Not on file  Stress: Not on file  Social Connections: Not on file  Intimate Partner Violence: Not on file    Family History:    Family History  Problem Relation Age of Onset   Colon cancer Sister 41   Lung cancer Sister    Colon cancer Brother 90       METS   Other Maternal Grandfather        ? colon cancer seen on 2005 colon report   Colon cancer Maternal Grandfather    Esophageal cancer Neg Hx    Rectal cancer Neg Hx    Stomach cancer Neg Hx      ROS:  Please see the history of present illness.  All other ROS reviewed and negative.     Physical Exam/Data:   Vitals:   05/31/22 0622 05/31/22 0630 05/31/22 0645 05/31/22 0700  BP: 130/74 126/66 129/61 133/76  Pulse: 63 68 (!) 59 67  Resp: '15 20 14 17  '$ Temp:      TempSrc:      SpO2: 99% 93% 98% 99%  Weight:      Height:        Intake/Output Summary (Last 24 hours) at 05/31/2022 0908 Last data filed at 05/31/2022 0528 Gross per 24 hour  Intake --  Output 250 ml  Net -250 ml      05/30/2022   11:20 PM 05/09/2022    9:41 AM 09/15/2021   11:26 AM  Last 3 Weights  Weight (lbs) 204 lb 204 lb 9.6 oz 200 lb  Weight (kg) 92.534 kg 92.806 kg 90.719 kg     Body mass index is 30.13 kg/m.  General:  Well nourished, well developed, in no acute distress HEENT: normal Neck: no JVD Vascular: No carotid bruits; Distal pulses 2+ bilaterally, cap refill a little delayed on  the R, femoral pulses 2+ w/out bruits Cardiac:  normal S1, S2; RRR; no murmur  Lungs:  clear to auscultation bilaterally, no wheezing, rhonchi or rales  Abd: soft, nontender, no hepatomegaly  Ext: no edema Musculoskeletal:  No deformities, BUE and BLE strength normal and equal Skin: warm and dry  Neuro:  CNs 2-12 intact, no focal abnormalities noted Psych:  Normal affect   EKG:  The EKG was personally reviewed and demonstrates:  SR, HR 61, low voltage precordial leads, original ECG had diffuse inferolateral ST/T wave changes, resolved this am Telemetry:  Telemetry was personally reviewed and demonstrates:  SR  Relevant CV Studies: None previously  Laboratory Data:  High Sensitivity Troponin:   Recent Labs  Lab 05/30/22 2317 05/31/22 0115 05/31/22 0307 05/31/22 0518 05/31/22 0706  TROPONINIHS 12 75* 129* 218* 231*     Chemistry Recent Labs  Lab 05/30/22 2317 05/30/22 2348 05/30/22 2349  NA 139 142 143  K 3.8 3.7 3.7  CL 107  --  106  CO2 25  --   --   GLUCOSE 169*  --  154*  BUN 20  --  22  CREATININE 1.08*  --  0.90  CALCIUM 9.0  --   --   GFRNONAA 55*  --   --   ANIONGAP 7  --   --     Recent Labs  Lab 05/30/22 2317  PROT 7.1  ALBUMIN 3.7  AST 21  ALT 15  ALKPHOS 78  BILITOT 0.7   Lipids No results for input(s): "CHOL", "TRIG", "HDL", "LABVLDL", "LDLCALC", "CHOLHDL" in the last 168 hours.  Hematology Recent Labs  Lab 05/30/22 2317 05/30/22 2348 05/30/22 2349  WBC 7.6  --   --   RBC 4.71  --   --   HGB 14.0 14.3 14.3  HCT 43.4 42.0 42.0  MCV 92.1  --   --   MCH 29.7  --   --   MCHC 32.3  --   --   RDW 13.2  --   --   PLT 227  --   --    Thyroid No results for input(s): "TSH", "FREET4" in the last 168 hours.  BNP Recent Labs  Lab 05/30/22 2317  BNP 185.8*    DDimer  Recent Labs  Lab 05/30/22 2317  DDIMER 0.73*     Radiology/Studies:  CT Angio Chest PE W and/or Wo Contrast  Result Date: 05/31/2022 CLINICAL DATA:  Pulmonary  embolism (PE) suspected, positive D-dimer. Weakness, dizziness EXAM: CT ANGIOGRAPHY CHEST WITH CONTRAST TECHNIQUE: Multidetector CT imaging of the chest was performed using the standard protocol during bolus administration of intravenous contrast. Multiplanar CT image reconstructions and MIPs were obtained to evaluate the vascular anatomy. RADIATION DOSE REDUCTION: This exam was performed according to the departmental dose-optimization program which includes automated exposure control, adjustment of the mA and/or kV according to patient size and/or use of iterative reconstruction technique. CONTRAST:  52m OMNIPAQUE IOHEXOL 350 MG/ML SOLN COMPARISON:  None Available. FINDINGS: Cardiovascular: Opacification of the pulmonary arterial tree is slightly suboptimal and there is limited evaluation of the segmental and subsegmental pulmonary arterial tree. No central intraluminal filling defect identified through the level of the lobar pulmonary arteries. The central pulmonary arteries are of normal caliber. No significant coronary artery calcification. Global cardiac size within normal limits. No pericardial effusion. Mild atherosclerotic calcification within the thoracic aorta. No aortic aneurysm. Mediastinum/Nodes: 16 mm nodule within the left thyroid gland, not well characterized on this examination. No pathologic thoracic adenopathy. The esophagus is unremarkable. Lungs/Pleura: Mild emphysema. Bronchial wall thickening and scattered airway impaction peripherally is present in keeping with airway inflammation. No confluent pulmonary  infiltrate. 5 mm subpleural pulmonary nodules are identified within the right lower lobe at axial image # 92 and # 95, series 6. These are indeterminate. No central obstructing lesion. No pneumothorax or pleural effusion. Upper Abdomen: Cholecystectomy has been performed. No acute abnormality. Musculoskeletal: No acute bone abnormality. No lytic or blastic bone lesion. Review of the MIP  images confirms the above findings. IMPRESSION: 1. No central pulmonary embolus. Limited evaluation of the segmental and subsegmental pulmonary arterial tree. 2. Mild emphysema. 3. Bronchial wall thickening and scattered airway impaction in keeping with airway inflammation. No confluent pulmonary infiltrate. 4. 5 mm subpleural pulmonary nodules within the right lower lobe, indeterminate. No follow-up needed if patient is low-risk (and has no known or suspected primary neoplasm). Non-contrast chest CT can be considered in 12 months if patient is high-risk. This recommendation follows the consensus statement: Guidelines for Management of Incidental Pulmonary Nodules Detected on CT Images: From the Fleischner Society 2017; Radiology 2017; 284:228-243. 5. 16 mm nodule within the left thyroid gland, not well characterized on this examination. Recommend thyroid US (ref: J Am Coll Radiol. 2015 Feb;12(2): 143-50). Emphysema (ICD10-J43.9). Electronically Signed   By: Fidela Salisbury M.D.   On: 05/31/2022 02:15   DG Chest Port 1 View  Result Date: 05/31/2022 CLINICAL DATA:  sob EXAM: PORTABLE CHEST 1 VIEW COMPARISON:  Chest x-ray 10/25/2017 FINDINGS: The heart and mediastinal contours are unchanged given AP portable technique. Aortic calcification. No focal consolidation. No pulmonary edema. No pleural effusion. No pneumothorax. No acute osseous abnormality. IMPRESSION: 1. No active disease. 2.  Aortic Atherosclerosis (ICD10-I70.0). Electronically Signed   By: Iven Finn M.D.   On: 05/31/2022 00:07   VAS [place] LOWER EXT ART SEG MULTI (SEGMENTALS & LE RAYNAUDS)  Result Date: 05/27/2022  LOWER EXTREMITY DOPPLER STUDY Patient Name:  LAILANA SHIRA  Date of Exam:   05/27/2022 Medical Rec #: [id number]       Accession #:    [id number] Date of Birth: 01/11/1951        Patient Gender: F Patient Age:   64 years Exam Location:  [facility] Procedure:      VAS [place] LOWER EXT ART SEG MULTI (SEGMENTALS & LE RAYNAUDS) Referring Phys:  Gwyndolyn Saxon HENSEL --------------------------------------------------------------------------------  Indications: Claudication. High Risk Factors: Hypertension, hyperlipidemia, past history of smoking. Other Factors: Patient complains of achy legs after walking for about five                minutes.  Performing Technologist: Wilkie Aye RVT  Examination Guidelines: A complete evaluation includes at minimum, Doppler waveform signals and systolic blood pressure reading at the level of bilateral brachial, anterior tibial, and posterior tibial arteries, when vessel segments are accessible. Bilateral testing is considered an integral part of a complete examination. Photoelectric Plethysmograph (PPG) waveforms and toe systolic pressure readings are included as required and additional duplex testing as needed. Limited examinations for reoccurring indications may be performed as noted.  ABI Findings: +---------+------------------+-----+--------+--------+ Right    Rt Pressure (mmHg)IndexWaveformComment  +---------+------------------+-----+--------+--------+ Brachial 161                                     +---------+------------------+-----+--------+--------+ CFA                             biphasic         +---------+------------------+-----+--------+--------+ Popliteal  biphasic         +---------+------------------+-----+--------+--------+ PTA      158               0.98 biphasic         +---------+------------------+-----+--------+--------+ PERO     151               0.94 biphasic         +---------+------------------+-----+--------+--------+ DP       147               0.91 biphasic         +---------+------------------+-----+--------+--------+ +---------+------------------+-----+--------+-------+ Left     Lt Pressure (mmHg)IndexWaveformComment +---------+------------------+-----+--------+-------+ Brachial 160                                     +---------+------------------+-----+--------+-------+ CFA                             biphasic        +---------+------------------+-----+--------+-------+ Popliteal                       biphasic        +---------+------------------+-----+--------+-------+ PTA      143               0.89 biphasic        +---------+------------------+-----+--------+-------+ PERO     128               0.80 biphasic        +---------+------------------+-----+--------+-------+ DP       139               0.86 biphasic        +---------+------------------+-----+--------+-------+   Summary: Right: Resting right ankle-brachial index is within normal range. No evidence of significant right lower extremity arterial disease. Left: Resting left ankle-brachial index indicates mild left lower extremity arterial disease.  Suggest a lower arterial duplex to be ordered.  *See table(s) above for measurements and observations.  Vascular consult recommended. Electronically signed by Larae Grooms MD on 05/27/2022 at 10:21:02 PM.    Final      Assessment and Plan:   Elevated troponin - no significant coronary calcifications noted on CT - no hx exertional CP - sister w/ hx stenting at the age she is now.  - some ECG changes yesterday, resolved today - ck echo, discuss cardiac CT vs cath w/ MD  2. Palpitations - no ectopy seen on telemetry - continue to follow on telemetry - may need to wear a monitor at d/c   For questions or updates, please contact [place] Please consult [email or web address] for contact info under  Signed, Rosaria Ferries, PA-C  05/31/2022 9:08 AM As above, patient seen and examined.  Briefly she is a 71 year old female with past medical history of hypertension, hyperlipidemia, family history of coronary artery disease, remote tobacco abuse for evaluation of dyspnea, palpitations and elevated troponin.  Patient has noticed recent increased dyspnea on exertion.  She denies orthopnea,  PND, pedal edema, chest pain or syncope.  Over the past several months she has also had intermittent palpitations that can occur both with exertion and at rest.  She had palpitations that were sudden onset last evening at approximately 9:30 PM.  There is associated dizziness, shortness of breath and nausea.  No chest pain.  Her  symptoms lasted approximately 15 minutes and resolved.  She presented to the emergency room and cardiology now asked to evaluate.  Patient also complains of bilateral lower extremity pain.  1 palpitations-etiology unclear.  We will arrange echocardiogram to assess LV function.  Continue to follow on telemetry.  Outpatient monitor at time of discharge.  2 non-ST elevation myocardial infarction-patient presented with palpitations associated with dyspnea.  Her initial electrocardiogram showed inferolateral ST depression (resolved with follow-up ECG) and her troponin increased from 12 to 231.  I feel definitive evaluation is warranted.  Plan cardiac catheterization.  The risk and benefits including myocardial infarction, CVA and death discussed and she agrees to proceed.  Note she also has had progressive dyspnea on exertion.  3 leg pain-recent ABIs normal on the right and mild on the left.  4 hypertension-follow blood pressure and advance medications as needed.  Presently on losartan and metoprolol.  5 hyperlipidemia-continue statin.  Kirk Ruths, MD

## 2022-05-31 NOTE — Assessment & Plan Note (Signed)
63m thyroid nodule noted on CTA in left thyroid gland. Most recent TSH 7/17 wnl. -Thyroid [place]commended outpatient

## 2022-05-31 NOTE — Assessment & Plan Note (Signed)
Concern for hypertensive emergency given initial elevation to 161W systolic with associated SOB and dizziness. BP improved w/o intervention.  -Continue home losartan, lopressor -Cardiac monitoring

## 2022-05-31 NOTE — H&P (Addendum)
Hospital Admission History and Physical Service Pager: [phone]  Patient name: Andrea Robles Medical record number: [id number] Date of Birth: Jan 22, 1951 Age: 71 y.o. Gender: female  Primary Care Provider: Zenia Resides, MD Consultants: none Code Status: FULL Preferred Emergency Contact: spouse Andrea Robles" Contact Information     Name Relation Home Work Mobile   Robles,Andrea L Spouse [phone]          Chief Complaint: shortness of breath  Assessment and Plan: Andrea Robles is a 71 y.o. female presenting with shortness of breath and dizziness in the setting of hypertension to 675F systolic. Her BP and symptoms have improved since onset and she is currently stable. Requires admission given her elevation in troponin with associated hypertension concerning for hypertensive emergency. Lower concern for ACS but remains on the differential as below.   * Dyspnea DDx includes hypertensive emergency vs stable angina vs less likely ACS. Troponin 12>75>129, suspect demand ischemia in the setting of hypertension. Given ASA 324 x1. D-dimer elevated but CTA negative for PE. EKG with no evidence of acute ischemia. Low suspicion for infectious etiology given history and normal CBC. Mild emphysema noted on CTA which may be contributing. -Admit to med-tele, attending Dr. Owens Shark -Cardiac monitoring -Continue to trend troponin -Consider cardiology consult if troponin continues to rise  Hypertension Concern for hypertensive emergency given initial elevation to 163W systolic with associated SOB and dizziness. BP improved w/o intervention.  -Continue home losartan, lopressor -Cardiac monitoring   Hyperlipidemia Most recent lipid panel 7/17 showing hypertriglyceridemia 153. CXR on admission showing aortic atherosclerosis. Recent vascular US done on 8/4 showing mild LLE arterial disease. -Continue home Lipitor -LLE doppler ordered outpatient  Lung nodule 54m subpleural pulmonary  nodules in RLL noted on CTA.  -Repeat non-con chest CT in 1 yr  Thyroid nodule 138mthyroid nodule noted on CTA in left thyroid gland. Most recent TSH 7/17 wnl. -Thyroid [place] outpatient       FEN/GI: Heart healthy diet VTE Prophylaxis: Lovenox  Disposition: home pending clinical improvement  History of Present Illness:  Andrea MCFARLANDs a [age].o. female presenting with shortness of breath and elevated blood pressure.  She started feeling bad around 9:30 PM earlier this evening when she went to bed that occurred while watching TV. She started feeling dizzy and dyspneic (reports feeling "strange", checked her BP and noted to have significantly elevated SBP 188. Reports symptoms lasted about 2.5 hours. She had one loose BM as well. She is still feeling occasional palpitations/"fluttering". Denies chest pain, fever, cough. Endorses occasional dyspnea on exertion without chest pain.  She has had ongoing issues with leg pain for the past 3 days which worsened earlier today. She notes that both feet will often feel cold or will turn blue. She had a recent vascular [place] on 8/4 which was consistent with mild left lower extremity arterial disease.  In the ED, given Duoneb x1 with questionable improvement in symptoms  Review Of Systems: Per HPI with the following additions:  Review of Systems  Constitutional:  Negative for fever.  Respiratory:  Positive for shortness of breath. Negative for cough.   Cardiovascular:  Positive for palpitations and claudication. Negative for chest pain.  Gastrointestinal:  Positive for nausea. Negative for abdominal pain and vomiting.  Neurological:  Positive for dizziness. Negative for headaches.     Pertinent Past Medical History: CKD IIIa Remainder reviewed in history tab.   Pertinent Past Surgical History: Cholecystectomy Hysterectomy  Remainder  reviewed in history tab.  Pertinent Social History: Tobacco use: Yes/No/Former 0.5 ppd  smoker for 40 years, quit 12 years ago Alcohol use: seldom, last had a beer in June Other Substance use: THC (for back pain) but not lately Lives with husband Andrea Robles and dog  Pertinent Family History: No FMHx of CAD Mother: diabetes, thyroid disease  Remainder reviewed in history tab.   Important Outpatient Medications: Losartan 100 mg Metoprolol tartrate 25 mg Celecoxib Methocarbamol (to take prn, has not taken any recently) Reports good adherence to medications Remainder reviewed in medication history.   Objective: BP 134/73   Pulse 70   Temp 98 F (36.7 C) (Oral)   Resp 16   Ht '5\' 9"'$  (1.753 m)   Wt 92.5 kg   SpO2 97%   BMI 30.13 kg/m  Exam: General: Alert, sitting up in bed, conversational Cardiovascular: RRR, no m/r/g appreciated Respiratory: CTAB, normal WOB, no rales/rhonchi/wheezes Gastrointestinal: Soft, NT/ND, +BS MSK: B/l lower extremities nontender to palpation, no discoloration or swelling noted Neuro: No focal deficits noted  Labs:  CBC BMET  Recent Labs  Lab 05/30/22 2317 05/30/22 2348 05/30/22 2349  WBC 7.6  --   --   HGB 14.0   < > 14.3  HCT 43.4   < > 42.0  PLT 227  --   --    < > = values in this interval not displayed.   Recent Labs  Lab 05/30/22 2317 05/30/22 2348 05/30/22 2349  NA 139   < > 143  K 3.8   < > 3.7  CL 107  --  106  CO2 25  --   --   BUN 20  --  22  CREATININE 1.08*  --  0.90  GLUCOSE 169*  --  154*  CALCIUM 9.0  --   --    < > = values in this interval not displayed.     Pertinent additional labs   Troponin 12>75>129 D-dimer 0.73 BNP 185.8  VBG: pH 7.264, pCO2 54.2, pO2 48, bicarb 24.6   EKG: My own interpretation (not copied from electronic read) NSR, unchanged from prior    Imaging Studies Performed:  CTA chest 1. No central pulmonary embolus. Limited evaluation of the segmental and subsegmental pulmonary arterial tree. 2. Mild emphysema. 3. Bronchial wall thickening and scattered airway impaction  in keeping with airway inflammation. No confluent pulmonary infiltrate. 4. 5 mm subpleural pulmonary nodules within the right lower lobe, indeterminate. No follow-up needed if patient is low-risk (and has no known or suspected primary neoplasm). Non-contrast chest CT can be considered in 12 months if patient is high-risk. This recommendation follows the consensus statement: Guidelines for Management of Incidental Pulmonary Nodules Detected on CT Images: From the Fleischner Society 2017; Radiology 2017; 284:228-243. 5. 16 mm nodule within the left thyroid gland, not well characterized on this examination. Recommend thyroid US (ref: J Am Coll Radiol. 2015 Feb;12(2): 143-50).   Emphysema (ICD10-J43.9).    CXR Impression from Radiologist:  1. No active disease. 2.  Aortic Atherosclerosis (ICD10-I70.0).     My Interpretation: No acute process, possible cardiomegaly   August Albino, MD 05/31/2022, 4:47 AM PGY-1, [place] Intern pager: [phone], text pages welcome Secure chat group [facility] Teaching Service   Sudden onset dyspnea, dizziness, and malaise now improved, associated with significantly elevated BP. Mild elevation in troponin possible demand from severe HTN. Lower concern for ACS, no chest pain. HEART score 3 (low) based  on initial troponin. Will continue to trend troponin and consider cardiology consult if significant rise or developing symptoms.  I was personally present and performed or re-performed the history, physical exam and medical decision making activities of this service and have verified that the service and findings are accurately documented in the resident's note.  Zola Button, MD                  05/31/2022, 5:12 AM

## 2022-05-31 NOTE — H&P (View-Only) (Signed)
Cardiology Consultation:   Patient ID: Andrea Robles MRN: [id number]; DOB: [birth year]  Admit date: 05/30/2022 Date of Consult: 05/31/2022  PCP:  Zenia Resides, MD   [facility] Providers Cardiologist:  None        Patient Profile:   Andrea Robles is a 71 y.o. female with a hx of HTN, HLD, FH CAD, seasonal allergies, who is being seen 05/31/2022 for the evaluation of chest fluttering, elevated troponin, at the request of Dr McDiarmid.  History of Present Illness:   Ms. Westergaard has never been evaluated by Cardiology before.   The heart fluttering will happen at rest and with exertion. Metoprolol used to help, but has seemed less effective recently. The episodes last <-10 sec. Sometimes she gets light-headed or dizzy with the fluttering.   She does housework, yard work, but has not been able to do what she used to because of her back. She gets more fluttering when she is walking around, more dizziness. No chest pain.  She also has problems w/ pain in her legs and feet. Sometimes toes turn purple. Feet are always cold. Sometimes has resting leg pain. A nurse made a home visit and thought she had PAD.   Last pm, she had a spell that lasted longer than usual and was associated w/ SOB. This concerned her so she came to the hospital.  In the hospital, her BP has been labile, but generally feels better. Some small flutters.    Past Medical History:  Diagnosis Date   Allergy    Hypercholesteremia    Hypertension     Past Surgical History:  Procedure Laterality Date   ABDOMINAL HYSTERECTOMY     CHOLECYSTECTOMY     COLONOSCOPY  01/19/2015   POLYPECTOMY       Home Medications:  Prior to Admission medications   Medication Sig Start Date End Date Taking? Authorizing Provider  atorvastatin (LIPITOR) 20 MG tablet Take 1 tablet (20 mg total) by mouth daily. 12/20/21   Zenia Resides, MD  celecoxib (CELEBREX) 200 MG capsule Take 1 capsule (200 mg total) by mouth 2 (two) times  daily between meals as needed. 05/25/22   Mcarthur Rossetti, MD  cetirizine (ZYRTEC) 10 MG tablet Take 10 mg by mouth daily.    [provider]  diclofenac Sodium (VOLTAREN) 1 % GEL Apply 2 g topically 4 (four) times daily. 05/12/22   Zenia Resides, MD  Eflornithine HCl 13.9 % cream Apply twice daily to remove hair. 07/15/21   Zenia Resides, MD  LORazepam (ATIVAN) 0.5 MG tablet Take 1 tablet by mouth twice daily as needed for anxiety 08/12/20   Zenia Resides, MD  losartan (COZAAR) 100 MG tablet Take 1 tablet (100 mg total) by mouth at bedtime. 12/20/21   Zenia Resides, MD  methocarbamol (ROBAXIN) 750 MG tablet Take 1 tablet (750 mg total) by mouth every 8 (eight) hours as needed for muscle spasms. 05/25/22   Mcarthur Rossetti, MD  metoprolol tartrate (LOPRESSOR) 25 MG tablet Take 1 tablet (25 mg total) by mouth 2 (two) times daily. 12/20/21   Zenia Resides, MD    Inpatient Medications: Scheduled Meds:  atorvastatin  20 mg Oral Daily   enoxaparin (LOVENOX) injection  40 mg Subcutaneous Q24H   losartan  100 mg Oral QHS   metoprolol tartrate  25 mg Oral BID   Continuous Infusions:  PRN Meds:   Allergies:    Allergies  Allergen Reactions   Statins  Other (See Comments)    Myalgias with pravastatin, simvastatin, rosuvastatin. Able to tolerate Atorvastatin   Colcrys [Colchicine] Diarrhea   Hydrocodone Other (See Comments)    Tachycardia   Flagyl [Metronidazole] Itching and Rash   Flexeril [Cyclobenzaprine] Other (See Comments)    Drowsiness    Pamelor [Nortriptyline] Other (See Comments)    Drowsiness    Pepcid [Famotidine] Itching and Rash    Social History:   Social History   Socioeconomic History   Marital status: Married    Spouse name: Not on file   Number of children: Not on file   Years of education: Not on file   Highest education level: Not on file  Occupational History   Not on file  Tobacco Use   Smoking status: Former     Packs/day: 0.50    Years: 44.00    Total pack years: 22.00    Types: Cigarettes    Quit date: 03/27/2012    Years since quitting: 10.1   Smokeless tobacco: Never  Vaping Use   Vaping Use: Never used  Substance and Sexual Activity   Alcohol use: Yes    Comment: ocassionally-rare   Drug use: No   Sexual activity: Never  Other Topics Concern   Not on file  Social History Narrative   Not on file   Social Determinants of Health   Financial Resource Strain: Not on file  Food Insecurity: Not on file  Transportation Needs: Not on file  Physical Activity: Not on file  Stress: Not on file  Social Connections: Not on file  Intimate Partner Violence: Not on file    Family History:    Family History  Problem Relation Age of Onset   Colon cancer Sister 76   Lung cancer Sister    Colon cancer Brother 62       METS   Other Maternal Grandfather        ? colon cancer seen on 2005 colon report   Colon cancer Maternal Grandfather    Esophageal cancer Neg Hx    Rectal cancer Neg Hx    Stomach cancer Neg Hx      ROS:  Please see the history of present illness.  All other ROS reviewed and negative.     Physical Exam/Data:   Vitals:   05/31/22 0622 05/31/22 0630 05/31/22 0645 05/31/22 0700  BP: 130/74 126/66 129/61 133/76  Pulse: 63 68 (!) 59 67  Resp: '15 20 14 17  '$ Temp:      TempSrc:      SpO2: 99% 93% 98% 99%  Weight:      Height:        Intake/Output Summary (Last 24 hours) at 05/31/2022 0908 Last data filed at 05/31/2022 0528 Gross per 24 hour  Intake --  Output 250 ml  Net -250 ml      05/30/2022   11:20 PM 05/09/2022    9:41 AM 09/15/2021   11:26 AM  Last 3 Weights  Weight (lbs) 204 lb 204 lb 9.6 oz 200 lb  Weight (kg) 92.534 kg 92.806 kg 90.719 kg     Body mass index is 30.13 kg/m.  General:  Well nourished, well developed, in no acute distress HEENT: normal Neck: no JVD Vascular: No carotid bruits; Distal pulses 2+ bilaterally, cap refill a little delayed on  the R, femoral pulses 2+ w/out bruits Cardiac:  normal S1, S2; RRR; no murmur  Lungs:  clear to auscultation bilaterally, no wheezing, rhonchi or rales  Abd: soft, nontender, no hepatomegaly  Ext: no edema Musculoskeletal:  No deformities, BUE and BLE strength normal and equal Skin: warm and dry  Neuro:  CNs 2-12 intact, no focal abnormalities noted Psych:  Normal affect   EKG:  The EKG was personally reviewed and demonstrates:  SR, HR 61, low voltage precordial leads, original ECG had diffuse inferolateral ST/T wave changes, resolved this am Telemetry:  Telemetry was personally reviewed and demonstrates:  SR  Relevant CV Studies: None previously  Laboratory Data:  High Sensitivity Troponin:   Recent Labs  Lab 05/30/22 2317 05/31/22 0115 05/31/22 0307 05/31/22 0518 05/31/22 0706  TROPONINIHS 12 75* 129* 218* 231*     Chemistry Recent Labs  Lab 05/30/22 2317 05/30/22 2348 05/30/22 2349  NA 139 142 143  K 3.8 3.7 3.7  CL 107  --  106  CO2 25  --   --   GLUCOSE 169*  --  154*  BUN 20  --  22  CREATININE 1.08*  --  0.90  CALCIUM 9.0  --   --   GFRNONAA 55*  --   --   ANIONGAP 7  --   --     Recent Labs  Lab 05/30/22 2317  PROT 7.1  ALBUMIN 3.7  AST 21  ALT 15  ALKPHOS 78  BILITOT 0.7   Lipids No results for input(s): "CHOL", "TRIG", "HDL", "LABVLDL", "LDLCALC", "CHOLHDL" in the last 168 hours.  Hematology Recent Labs  Lab 05/30/22 2317 05/30/22 2348 05/30/22 2349  WBC 7.6  --   --   RBC 4.71  --   --   HGB 14.0 14.3 14.3  HCT 43.4 42.0 42.0  MCV 92.1  --   --   MCH 29.7  --   --   MCHC 32.3  --   --   RDW 13.2  --   --   PLT 227  --   --    Thyroid No results for input(s): "TSH", "FREET4" in the last 168 hours.  BNP Recent Labs  Lab 05/30/22 2317  BNP 185.8*    DDimer  Recent Labs  Lab 05/30/22 2317  DDIMER 0.73*     Radiology/Studies:  CT Angio Chest PE W and/or Wo Contrast  Result Date: 05/31/2022 CLINICAL DATA:  Pulmonary  embolism (PE) suspected, positive D-dimer. Weakness, dizziness EXAM: CT ANGIOGRAPHY CHEST WITH CONTRAST TECHNIQUE: Multidetector CT imaging of the chest was performed using the standard protocol during bolus administration of intravenous contrast. Multiplanar CT image reconstructions and MIPs were obtained to evaluate the vascular anatomy. RADIATION DOSE REDUCTION: This exam was performed according to the departmental dose-optimization program which includes automated exposure control, adjustment of the mA and/or kV according to patient size and/or use of iterative reconstruction technique. CONTRAST:  39m OMNIPAQUE IOHEXOL 350 MG/ML SOLN COMPARISON:  None Available. FINDINGS: Cardiovascular: Opacification of the pulmonary arterial tree is slightly suboptimal and there is limited evaluation of the segmental and subsegmental pulmonary arterial tree. No central intraluminal filling defect identified through the level of the lobar pulmonary arteries. The central pulmonary arteries are of normal caliber. No significant coronary artery calcification. Global cardiac size within normal limits. No pericardial effusion. Mild atherosclerotic calcification within the thoracic aorta. No aortic aneurysm. Mediastinum/Nodes: 16 mm nodule within the left thyroid gland, not well characterized on this examination. No pathologic thoracic adenopathy. The esophagus is unremarkable. Lungs/Pleura: Mild emphysema. Bronchial wall thickening and scattered airway impaction peripherally is present in keeping with airway inflammation. No confluent pulmonary  infiltrate. 5 mm subpleural pulmonary nodules are identified within the right lower lobe at axial image # 92 and # 95, series 6. These are indeterminate. No central obstructing lesion. No pneumothorax or pleural effusion. Upper Abdomen: Cholecystectomy has been performed. No acute abnormality. Musculoskeletal: No acute bone abnormality. No lytic or blastic bone lesion. Review of the MIP  images confirms the above findings. IMPRESSION: 1. No central pulmonary embolus. Limited evaluation of the segmental and subsegmental pulmonary arterial tree. 2. Mild emphysema. 3. Bronchial wall thickening and scattered airway impaction in keeping with airway inflammation. No confluent pulmonary infiltrate. 4. 5 mm subpleural pulmonary nodules within the right lower lobe, indeterminate. No follow-up needed if patient is low-risk (and has no known or suspected primary neoplasm). Non-contrast chest CT can be considered in 12 months if patient is high-risk. This recommendation follows the consensus statement: Guidelines for Management of Incidental Pulmonary Nodules Detected on CT Images: From the Fleischner Society 2017; Radiology 2017; 284:228-243. 5. 16 mm nodule within the left thyroid gland, not well characterized on this examination. Recommend thyroid US (ref: J Am Coll Radiol. 2015 Feb;12(2): 143-50). Emphysema (ICD10-J43.9). Electronically Signed   By: Fidela Salisbury M.D.   On: 05/31/2022 02:15   DG Chest Port 1 View  Result Date: 05/31/2022 CLINICAL DATA:  sob EXAM: PORTABLE CHEST 1 VIEW COMPARISON:  Chest x-ray 10/25/2017 FINDINGS: The heart and mediastinal contours are unchanged given AP portable technique. Aortic calcification. No focal consolidation. No pulmonary edema. No pleural effusion. No pneumothorax. No acute osseous abnormality. IMPRESSION: 1. No active disease. 2.  Aortic Atherosclerosis (ICD10-I70.0). Electronically Signed   By: Iven Finn M.D.   On: 05/31/2022 00:07   VAS [place] LOWER EXT ART SEG MULTI (SEGMENTALS & LE RAYNAUDS)  Result Date: 05/27/2022  LOWER EXTREMITY DOPPLER STUDY Patient Name:  Andrea Robles  Date of Exam:   05/27/2022 Medical Rec #: [id number]       Accession #:    [id number] Date of Birth: 04-25-1951        Patient Gender: F Patient Age:   59 years Exam Location:  [facility] Procedure:      VAS [place] LOWER EXT ART SEG MULTI (SEGMENTALS & LE RAYNAUDS) Referring Phys:  Gwyndolyn Saxon HENSEL --------------------------------------------------------------------------------  Indications: Claudication. High Risk Factors: Hypertension, hyperlipidemia, past history of smoking. Other Factors: Patient complains of achy legs after walking for about five                minutes.  Performing Technologist: Wilkie Aye RVT  Examination Guidelines: A complete evaluation includes at minimum, Doppler waveform signals and systolic blood pressure reading at the level of bilateral brachial, anterior tibial, and posterior tibial arteries, when vessel segments are accessible. Bilateral testing is considered an integral part of a complete examination. Photoelectric Plethysmograph (PPG) waveforms and toe systolic pressure readings are included as required and additional duplex testing as needed. Limited examinations for reoccurring indications may be performed as noted.  ABI Findings: +---------+------------------+-----+--------+--------+ Right    Rt Pressure (mmHg)IndexWaveformComment  +---------+------------------+-----+--------+--------+ Brachial 161                                     +---------+------------------+-----+--------+--------+ CFA                             biphasic         +---------+------------------+-----+--------+--------+ Popliteal  biphasic         +---------+------------------+-----+--------+--------+ PTA      158               0.98 biphasic         +---------+------------------+-----+--------+--------+ PERO     151               0.94 biphasic         +---------+------------------+-----+--------+--------+ DP       147               0.91 biphasic         +---------+------------------+-----+--------+--------+ +---------+------------------+-----+--------+-------+ Left     Lt Pressure (mmHg)IndexWaveformComment +---------+------------------+-----+--------+-------+ Brachial 160                                     +---------+------------------+-----+--------+-------+ CFA                             biphasic        +---------+------------------+-----+--------+-------+ Popliteal                       biphasic        +---------+------------------+-----+--------+-------+ PTA      143               0.89 biphasic        +---------+------------------+-----+--------+-------+ PERO     128               0.80 biphasic        +---------+------------------+-----+--------+-------+ DP       139               0.86 biphasic        +---------+------------------+-----+--------+-------+   Summary: Right: Resting right ankle-brachial index is within normal range. No evidence of significant right lower extremity arterial disease. Left: Resting left ankle-brachial index indicates mild left lower extremity arterial disease.  Suggest a lower arterial duplex to be ordered.  *See table(s) above for measurements and observations.  Vascular consult recommended. Electronically signed by Larae Grooms MD on 05/27/2022 at 10:21:02 PM.    Final      Assessment and Plan:   Elevated troponin - no significant coronary calcifications noted on CT - no hx exertional CP - sister w/ hx stenting at the age she is now.  - some ECG changes yesterday, resolved today - ck echo, discuss cardiac CT vs cath w/ MD  2. Palpitations - no ectopy seen on telemetry - continue to follow on telemetry - may need to wear a monitor at d/c   For questions or updates, please contact [place] Please consult [email or web address] for contact info under  Signed, Rosaria Ferries, PA-C  05/31/2022 9:08 AM As above, patient seen and examined.  Briefly she is a 71 year old female with past medical history of hypertension, hyperlipidemia, family history of coronary artery disease, remote tobacco abuse for evaluation of dyspnea, palpitations and elevated troponin.  Patient has noticed recent increased dyspnea on exertion.  She denies orthopnea,  PND, pedal edema, chest pain or syncope.  Over the past several months she has also had intermittent palpitations that can occur both with exertion and at rest.  She had palpitations that were sudden onset last evening at approximately 9:30 PM.  There is associated dizziness, shortness of breath and nausea.  No chest pain.  Her  symptoms lasted approximately 15 minutes and resolved.  She presented to the emergency room and cardiology now asked to evaluate.  Patient also complains of bilateral lower extremity pain.  1 palpitations-etiology unclear.  We will arrange echocardiogram to assess LV function.  Continue to follow on telemetry.  Outpatient monitor at time of discharge.  2 non-ST elevation myocardial infarction-patient presented with palpitations associated with dyspnea.  Her initial electrocardiogram showed inferolateral ST depression (resolved with follow-up ECG) and her troponin increased from 12 to 231.  I feel definitive evaluation is warranted.  Plan cardiac catheterization.  The risk and benefits including myocardial infarction, CVA and death discussed and she agrees to proceed.  Note she also has had progressive dyspnea on exertion.  3 leg pain-recent ABIs normal on the right and mild on the left.  4 hypertension-follow blood pressure and advance medications as needed.  Presently on losartan and metoprolol.  5 hyperlipidemia-continue statin.  Kirk Ruths, MD

## 2022-06-01 ENCOUNTER — Other Ambulatory Visit: Payer: Self-pay | Admitting: Home Health

## 2022-06-01 ENCOUNTER — Observation Stay (INDEPENDENT_AMBULATORY_CARE_PROVIDER_SITE_OTHER): Payer: Medicare Other

## 2022-06-01 ENCOUNTER — Encounter (HOSPITAL_COMMUNITY): Payer: Self-pay | Admitting: Interventional Cardiology

## 2022-06-01 DIAGNOSIS — R002 Palpitations: Secondary | ICD-10-CM

## 2022-06-01 DIAGNOSIS — R0609 Other forms of dyspnea: Secondary | ICD-10-CM | POA: Diagnosis not present

## 2022-06-01 DIAGNOSIS — R778 Other specified abnormalities of plasma proteins: Secondary | ICD-10-CM

## 2022-06-01 DIAGNOSIS — N179 Acute kidney failure, unspecified: Secondary | ICD-10-CM

## 2022-06-01 LAB — BASIC METABOLIC PANEL
Anion gap: 6 (ref 5–15)
BUN: 17 mg/dL (ref 8–23)
CO2: 23 mmol/L (ref 22–32)
Calcium: 8.5 mg/dL — ABNORMAL LOW (ref 8.9–10.3)
Chloride: 111 mmol/L (ref 98–111)
Creatinine, Ser: 0.98 mg/dL (ref 0.44–1.00)
GFR, Estimated: >60 mL/min (ref >60)
Glucose, Bld: 101 mg/dL — ABNORMAL HIGH (ref 70–99)
Potassium: 3.8 mmol/L (ref 3.5–5.1)
Sodium: 140 mmol/L (ref 135–145)

## 2022-06-01 NOTE — Progress Notes (Unsigned)
Enrolled for [employer] to mail a ZIO XT long term holter monitor to the patients address on file.   Dr. Crenshaw to read. 

## 2022-06-01 NOTE — Discharge Instructions (Addendum)
Dear Andrea Robles,   Thank you for letting us participate in your care! In this section, you will find a brief hospital admission summary of why you were admitted to the hospital and post-hospital plan.  You were admitted because you were experiencing shortness of breath.  And your heart catheterization showed no occlusion of your heart vessels. You will be discharged with heart monitor and will follow up with cardiology outpatient.  Some nodules were found on your thyroid gland and right Lung that do not appear concerning at this time. You can discuss this findings with your PCP. Plan is to monitor them.  POST-HOSPITAL & CARE INSTRUCTIONS Please let PCP/Specialists know of any changes in medications that were made.  Please see medications section of this packet for any medication changes.  Follow up with you PCP about getting a pulmonary function test to test for COPD/ Emphysema   DOCTOR'S APPOINTMENTS & FOLLOW UP Future Appointments  Date Time Provider [place]  06/13/2022  2:20 PM Almyra Deforest, [place] [facility]     Thank you for choosing [facility]! Take care and be well!  [facility]  [place], [place] [address] [phone]

## 2022-06-01 NOTE — Progress Notes (Signed)
2 week Zio monitor ordered, office staff will contact patient for enrollment instruction

## 2022-06-01 NOTE — Progress Notes (Signed)
   06/01/22 0800  Vitals  Temp 97.7 F (36.5 C)  Temp Source Oral  BP 136/72  MAP (mmHg) 89  Patient Position (if appropriate) Orthostatic Vitals  Pulse Rate 76  ECG Heart Rate 79  Level of Consciousness  Level of Consciousness Alert  MEWS COLOR  MEWS Score Color Green  Orthostatic Lying   BP- Lying 136/72  Pulse- Lying 69  Orthostatic Sitting  BP- Sitting 153/73  Pulse- Sitting 68  Orthostatic Standing at 0 minutes  BP- Standing at 0 minutes 157/75  Pulse- Standing at 0 minutes 79  Orthostatic Standing at 3 minutes  BP- Standing at 3 minutes 144/74  Pulse- Standing at 3 minutes 81  Oxygen Therapy  SpO2 97 %  O2 Device Room Air  Pain Assessment  Pain Scale 0-10  Pain Score 0  Pain Type Acute pain  Glasgow Coma Scale  Eye Opening 4  Best Verbal Response (NON-intubated) 5  Best Motor Response 6  Glasgow Coma Scale Score 15  MEWS Score  MEWS Temp 0  MEWS Systolic 0  MEWS Pulse 0  MEWS RR 0  MEWS LOC 0  MEWS Score 0

## 2022-06-01 NOTE — Progress Notes (Addendum)
Progress Note  Patient Name: Andrea Robles Date of Encounter: 06/01/2022  [facility] Cardiologist: New to Dr Stanford Breed   Subjective   She continue to have leg pain, all the time, independent from movement. She denied chest pain.   Inpatient Medications    Scheduled Meds:  aspirin  81 mg Oral Daily   atorvastatin  20 mg Oral Daily   heparin  5,000 Units Subcutaneous Q8H   losartan  100 mg Oral QHS   metoprolol tartrate  25 mg Oral BID   sodium chloride flush  3 mL Intravenous Q12H   Continuous Infusions:  sodium chloride     PRN Meds: sodium chloride, acetaminophen, ondansetron (ZOFRAN) IV, oxyCODONE, sodium chloride flush   Vital Signs    Vitals:   05/31/22 1920 05/31/22 2023 06/01/22 0006 06/01/22 0525  BP: (!) 158/65 (!) 154/68 99/66 (!) 119/56  Pulse: 74 73 61 60  Resp: '15 15 15 16  '$ Temp:  98.1 F (36.7 C) 97.7 F (36.5 C) (!) 97.5 F (36.4 C)  TempSrc:  Oral Oral Oral  SpO2: 97% 97% 96% 97%  Weight:      Height:        Intake/Output Summary (Last 24 hours) at 06/01/2022 0743 Last data filed at 06/01/2022 0500 Gross per 24 hour  Intake 650 ml  Output 900 ml  Net -250 ml      05/30/2022   11:20 PM 05/09/2022    9:41 AM 09/15/2021   11:26 AM  Last 3 Weights  Weight (lbs) 204 lb 204 lb 9.6 oz 200 lb  Weight (kg) 92.534 kg 92.806 kg 90.719 kg      Telemetry    Sinus rhythm with PACs - Personally Reviewed  ECG    No new tracing today - Personally Reviewed  Physical Exam   GEN: No acute distress.   Neck: No JVD Cardiac: RRR, no murmurs, rubs, or gallops.  Respiratory: Clear to auscultation bilaterally. GI: Soft, nontender MS: No edema Neuro:  Nonfocal  Psych: Normal affect  Right wrist with dressing, no bleeding or hematoma   Labs    High Sensitivity Troponin:   Recent Labs  Lab 05/31/22 0115 05/31/22 0307 05/31/22 0518 05/31/22 0706 05/31/22 0920  TROPONINIHS 75* 129* 218* 231* 197*     Chemistry Recent Labs  Lab  05/30/22 2317 05/30/22 2348 05/30/22 2349 05/31/22 2023 06/01/22 0322  NA 139 142 143  --  140  K 3.8 3.7 3.7  --  3.8  CL 107  --  106  --  111  CO2 25  --   --   --  23  GLUCOSE 169*  --  154*  --  101*  BUN 20  --  22  --  17  CREATININE 1.08*  --  0.90 1.10* 0.98  CALCIUM 9.0  --   --   --  8.5*  PROT 7.1  --   --   --   --   ALBUMIN 3.7  --   --   --   --   AST 21  --   --   --   --   ALT 15  --   --   --   --   ALKPHOS 78  --   --   --   --   BILITOT 0.7  --   --   --   --   GFRNONAA 55*  --   --  54* >60  ANIONGAP 7  --   --   --  6    Lipids No results for input(s): "CHOL", "TRIG", "HDL", "LABVLDL", "LDLCALC", "CHOLHDL" in the last 168 hours.  Hematology Recent Labs  Lab 05/30/22 2317 05/30/22 2348 05/30/22 2349 05/31/22 2023  WBC 7.6  --   --  8.4  RBC 4.71  --   --  4.68  HGB 14.0 14.3 14.3 13.9  HCT 43.4 42.0 42.0 42.2  MCV 92.1  --   --  90.2  MCH 29.7  --   --  29.7  MCHC 32.3  --   --  32.9  RDW 13.2  --   --  13.4  PLT 227  --   --  238   Thyroid No results for input(s): "TSH", "FREET4" in the last 168 hours.  BNP Recent Labs  Lab 05/30/22 2317  BNP 185.8*    DDimer  Recent Labs  Lab 05/30/22 2317  DDIMER 0.73*     Radiology    CARDIAC CATHETERIZATION  Result Date: 05/31/2022   The left ventricular systolic function is normal.   LV end diastolic pressure is normal.   The left ventricular ejection fraction is 55-65% by visual estimate. CONCLUSIONS: Right dominant coronary anatomy. Normal coronary arteries. Normal LV function with LVEDP 14 mmHg. Elevated troponin not explained by obstructive coronary disease.  Suspect demand ischemia/type II injury in setting of palpitations and likely sustained arrhythmia. RECOMMENDATIONS: Consider long-term monitoring. Okay to discontinue IV heparin from coronary standpoint.   ECHOCARDIOGRAM COMPLETE  Result Date: 05/31/2022    ECHOCARDIOGRAM REPORT   [name] Date of Exam: 05/31/2022  Medical Rec #:  [id number]      Height:       69.0 in Accession #:    [id number]     Weight:       204.0 lb Date of Birth:  1951-07-18       BSA:          2.083 m Patient Age:    71 years       BP:           162/70 mmHg Patient Gender: F              HR:           70 bpm. Exam Location:  Inpatient Procedure: 2D Echo, Cardiac Doppler and Color Doppler Indications:    Elevated troponin  History:        Patient has no prior history of Echocardiogram examinations.                 Risk Factors:Hypertension.  Sonographer:    Jefferey Pica Referring Phys: [id number] CARINA M BROWN IMPRESSIONS  1. Left ventricular ejection fraction, by estimation, is 60 to 65%. The left ventricle has normal function. The left ventricle has no regional wall motion abnormalities. Left ventricular diastolic parameters are consistent with Grade I diastolic dysfunction (impaired relaxation).  2. Right ventricular systolic function is normal. The right ventricular size is normal.  3. The mitral valve is normal in structure. Mild mitral valve regurgitation. No evidence of mitral stenosis.  4. The aortic valve is normal in structure. Aortic valve regurgitation is not visualized. No aortic stenosis is present.  5. The inferior vena cava is normal in size with greater than 50% respiratory variability, suggesting right atrial pressure of 3 mmHg. FINDINGS  Left Ventricle: Left ventricular ejection fraction, by estimation, is 60 to 65%. The left ventricle has normal function. The left ventricle has no regional wall motion  abnormalities. The left ventricular internal cavity size was normal in size. There is  no left ventricular hypertrophy. Left ventricular diastolic parameters are consistent with Grade I diastolic dysfunction (impaired relaxation). Right Ventricle: The right ventricular size is normal. No increase in right ventricular wall thickness. Right ventricular systolic function is normal. Left Atrium: Left atrial size was normal in size. Right  Atrium: Right atrial size was normal in size. Pericardium: There is no evidence of pericardial effusion. Mitral Valve: The mitral valve is normal in structure. Mild mitral valve regurgitation. No evidence of mitral valve stenosis. Tricuspid Valve: The tricuspid valve is normal in structure. Tricuspid valve regurgitation is not demonstrated. No evidence of tricuspid stenosis. Aortic Valve: The aortic valve is normal in structure. Aortic valve regurgitation is not visualized. No aortic stenosis is present. Aortic valve peak gradient measures 9.3 mmHg. Pulmonic Valve: The pulmonic valve was normal in structure. Pulmonic valve regurgitation is not visualized. No evidence of pulmonic stenosis. Aorta: The aortic root is normal in size and structure. Venous: The inferior vena cava is normal in size with greater than 50% respiratory variability, suggesting right atrial pressure of 3 mmHg. IAS/Shunts: No atrial level shunt detected by color flow Doppler.  LEFT VENTRICLE PLAX 2D LVIDd:         5.30 cm   Diastology LVIDs:         3.30 cm   LV e' medial:    7.77 cm/s LV PW:         1.00 cm   LV E/e' medial:  8.0 LV IVS:        0.90 cm   LV e' lateral:   7.38 cm/s LVOT diam:     1.90 cm   LV E/e' lateral: 8.4 LV SV:         72 LV SV Index:   34 LVOT Area:     2.84 cm  RIGHT VENTRICLE             IVC RV Basal diam:  2.80 cm     IVC diam: 1.80 cm RV S prime:     15.50 cm/s TAPSE (M-mode): 2.0 cm LEFT ATRIUM             Index        RIGHT ATRIUM           Index LA diam:        3.40 cm 1.63 cm/m   RA Area:     19.70 cm LA Vol (A2C):   54.2 ml 26.01 ml/m  RA Volume:   55.50 ml  26.64 ml/m LA Vol (A4C):   35.3 ml 16.94 ml/m LA Biplane Vol: 45.8 ml 21.98 ml/m  AORTIC VALVE                 PULMONIC VALVE AV Area (Vmax): 1.95 cm     PV Vmax:       0.87 m/s AV Vmax:        152.50 cm/s  PV Peak grad:  3.0 mmHg AV Peak Grad:   9.3 mmHg LVOT Vmax:      105.00 cm/s LVOT Vmean:     65.000 cm/s LVOT VTI:       0.253 m  AORTA Ao Root  diam: 3.40 cm Ao Asc diam:  3.20 cm MITRAL VALVE MV Area (PHT): 6.90 cm    SHUNTS MV Decel Time: 110 msec    Systemic VTI:  0.25 m MV E velocity: 61.80 cm/s  Systemic Diam: 1.90  cm MV A velocity: 80.60 cm/s MV E/A ratio:  0.77 Mihai Croitoru MD Electronically signed by Sanda Klein MD Signature Date/Time: 05/31/2022/3:03:59 PM    Final    CT Angio Chest PE W and/or Wo Contrast  Result Date: 05/31/2022 CLINICAL DATA:  Pulmonary embolism (PE) suspected, positive D-dimer. Weakness, dizziness EXAM: CT ANGIOGRAPHY CHEST WITH CONTRAST TECHNIQUE: Multidetector CT imaging of the chest was performed using the standard protocol during bolus administration of intravenous contrast. Multiplanar CT image reconstructions and MIPs were obtained to evaluate the vascular anatomy. RADIATION DOSE REDUCTION: This exam was performed according to the departmental dose-optimization program which includes automated exposure control, adjustment of the mA and/or kV according to patient size and/or use of iterative reconstruction technique. CONTRAST:  18m OMNIPAQUE IOHEXOL 350 MG/ML SOLN COMPARISON:  None Available. FINDINGS: Cardiovascular: Opacification of the pulmonary arterial tree is slightly suboptimal and there is limited evaluation of the segmental and subsegmental pulmonary arterial tree. No central intraluminal filling defect identified through the level of the lobar pulmonary arteries. The central pulmonary arteries are of normal caliber. No significant coronary artery calcification. Global cardiac size within normal limits. No pericardial effusion. Mild atherosclerotic calcification within the thoracic aorta. No aortic aneurysm. Mediastinum/Nodes: 16 mm nodule within the left thyroid gland, not well characterized on this examination. No pathologic thoracic adenopathy. The esophagus is unremarkable. Lungs/Pleura: Mild emphysema. Bronchial wall thickening and scattered airway impaction peripherally is present in keeping with  airway inflammation. No confluent pulmonary infiltrate. 5 mm subpleural pulmonary nodules are identified within the right lower lobe at axial image # 92 and # 95, series 6. These are indeterminate. No central obstructing lesion. No pneumothorax or pleural effusion. Upper Abdomen: Cholecystectomy has been performed. No acute abnormality. Musculoskeletal: No acute bone abnormality. No lytic or blastic bone lesion. Review of the MIP images confirms the above findings. IMPRESSION: 1. No central pulmonary embolus. Limited evaluation of the segmental and subsegmental pulmonary arterial tree. 2. Mild emphysema. 3. Bronchial wall thickening and scattered airway impaction in keeping with airway inflammation. No confluent pulmonary infiltrate. 4. 5 mm subpleural pulmonary nodules within the right lower lobe, indeterminate. No follow-up needed if patient is low-risk (and has no known or suspected primary neoplasm). Non-contrast chest CT can be considered in 12 months if patient is high-risk. This recommendation follows the consensus statement: Guidelines for Management of Incidental Pulmonary Nodules Detected on CT Images: From the Fleischner Society 2017; Radiology 2017; 284:228-243. 5. 16 mm nodule within the left thyroid gland, not well characterized on this examination. Recommend thyroid [place](ref: J Am Coll Radiol. 2015 Feb;12(2): 143-50). Emphysema (ICD10-J43.9). Electronically Signed   By: AFidela SalisburyM.D.   On: 05/31/2022 02:15   DG Chest Port 1 View  Result Date: 05/31/2022 CLINICAL DATA:  sob EXAM: PORTABLE CHEST 1 VIEW COMPARISON:  Chest x-ray 10/25/2017 FINDINGS: The heart and mediastinal contours are unchanged given AP portable technique. Aortic calcification. No focal consolidation. No pulmonary edema. No pleural effusion. No pneumothorax. No acute osseous abnormality. IMPRESSION: 1. No active disease. 2.  Aortic Atherosclerosis (ICD10-I70.0). Electronically Signed   By: MIven FinnM.D.   On: 05/31/2022  00:07    Cardiac Studies   LHC from 05/31/22:    The left ventricular systolic function is normal.   LV end diastolic pressure is normal.   The left ventricular ejection fraction is 55-65% by visual estimate.   CONCLUSIONS: Right dominant coronary anatomy. Normal coronary arteries. Normal LV function with LVEDP 14 mmHg. Elevated troponin not explained  by obstructive coronary disease.  Suspect demand ischemia/type II injury in setting of palpitations and likely sustained arrhythmia. RECOMMENDATIONS: Consider long-term monitoring. Okay to discontinue IV heparin from coronary standpoint.  Echo from 05/31/22:   1. Left ventricular ejection fraction, by estimation, is 60 to 65%. The  left ventricle has normal function. The left ventricle has no regional  wall motion abnormalities. Left ventricular diastolic parameters are  consistent with Grade I diastolic  dysfunction (impaired relaxation).   2. Right ventricular systolic function is normal. The right ventricular  size is normal.   3. The mitral valve is normal in structure. Mild mitral valve  regurgitation. No evidence of mitral stenosis.   4. The aortic valve is normal in structure. Aortic valve regurgitation is  not visualized. No aortic stenosis is present.   5. The inferior vena cava is normal in size with greater than 50%  respiratory variability, suggesting right atrial pressure of 3 mmHg.   Patient Profile     71 year old female with past medical history of hypertension, hyperlipidemia, family history of coronary artery disease, remote tobacco abuse for evaluation of dyspnea, palpitations and elevated troponin.   Assessment & Plan     NSTEMI- type II - presented with palpitations associated with dyspnea - EKG inferolateral ST depression initially, resolved subsequently  - Hs trop 12 > peaked at 231  - Echo 05/31/22 showed LVEF60-65%, grade I DD, normal RV, mild MR - LHC 05/31/22 showed normal LV systolic function, normal  LVEDP, normal coronary arteries  - consider other etiology for presenting symptoms, CAD ruled out, consider PFT given hx of tobacco use  - post cath care discussed at bedisde - follow up with cardiology arranged on 06/13/22  Heart palpitation - telemetry here showed PACs  - continue metoprolol  - TSH WNL on 05/09/22  - will arrange outpatient monitor Zio for 2 weeks, follow up with cardiology arranged on 06/13/22  HTN - BP is controlled on losartan and metoprolol   HLD - continue statin, HDL 45, LDL 91, tri 153 from 05/09/22, follow up with PCP   Leg pain - ABIfrom 05/27/22 mild on the left and normal on the right, arterial study pending  For questions or updates, please contact [facility] Please consult [email or web address] for contact info under  Signed, Margie Billet, NP  06/01/2022, 7:43 AM   As above, patient seen and examined.  She denies chest pain this morning.  No dyspnea.  Catheterization showed no coronary disease.  Echocardiogram shows preserved LV function.  Plan outpatient monitor for palpitations.  Will need follow-up with primary care for thyroid nodule and lung nodule.  Cardiology will sign off.  Would discharge on present medications.  Please call with questions. Kirk Ruths, MD

## 2022-06-01 NOTE — Assessment & Plan Note (Signed)
Patient creatinine this morning was 1.18 likely secondary to contrast CT. -Encourage oral fluid intake -Daily BMP

## 2022-06-01 NOTE — Discharge Summary (Addendum)
[facility] Discharge Summary  Patient name: Andrea Robles record number: [id number] Date of birth: Feb 06, 1951 Age: 71 y.o. Gender: female Date of Admission: 05/30/2022  Date of Discharge: 06/01/2022 Admitting Physician: August Albino, MD  Primary Care Provider: Zenia Resides, MD Consultants: Cardiology  Indication for Hospitalization: Dyspnea  Brief Hospital Course:  Andrea Robles is a 71 y.o. female who was admitted to the [facility] at [facility] for worsening Dyspnea and ACS rule out. Hospital course is outlined below by system.    Dyspnea  Patient p/w chief complaint of worsening dyspnea and elevated BP with dizziness. Her intially work up include chest CT that was negative for PE and CXR showed no concern for PNA. EKG showed NSR without ST changes, however had an uptrending Troponin as high as 231. Echo was obtained which showed EF of 60 to 65% with grade 1 diastolic dysfunction of the LV.  Cardiology consulted who performed Heart cath which ruled out CAD. Given her complaint of palpitation prior to admission and PACs on telemetry, cardiology recommend [place] monitoring outpatient with close follow up.   Lung nodule 71m subpleural pulmonary nodules in RLL noted on CTA. Will need Repeat non-con chest CT in 1 yr   Thyroid nodule 12mthyroid nodule noted on CTA in left thyroid gland. Most recent TSH 7/17 wnl. Will need thyroid [place]  PCP follow-up recommendations Given patient's complaint of palpitations prior to admission and cardiac monitoring showing PACs, patient to have outpatient Zio monitor per cards with follow up scheduled for 06/13/22 Given dyspnea and remote history of tobacco use patient will need out patient PFT CT chest show 5 mm subpleural Nodule, will need follow up non-contrast chest CT in 12 months Found to have 16 mm nodule on the left thyroid gland. Will need outpatient thyroid [place]  Discharge  Diagnoses/Problem List:  Dyspnea Hypertension Hyperlipidemia Lung nodule Thyroid nodule   Disposition: Home  Discharge Condition: Stable  Discharge Exam:  General: Alert, well appearing, NAD HEENT: Atraumatic, MMM, No sclera icterus CV: RRR, no murmurs, normal S1/S2 Pulm: CTAB, good WOB on RA, no crackles or wheezing Abd: Soft, no distension, no tenderness Skin: dry, warm Ext: No BLE edema, +2 Pedal and radial pulse.    Significant Procedures:  Heart catheterization  Significant Labs and Imaging:  Recent Labs  Lab 05/30/22 2317 05/30/22 2348 05/30/22 2349 05/31/22 2023  WBC 7.6  --   --  8.4  HGB 14.0 14.3 14.3 13.9  HCT 43.4 42.0 42.0 42.2  PLT 227  --   --  238   Recent Labs  Lab 05/30/22 2317 05/30/22 2348 05/30/22 2349 05/31/22 2023 06/01/22 0322  NA 139 142 143  --  140  K 3.8 3.7 3.7  --  3.8  CL 107  --  106  --  111  CO2 25  --   --   --  23  GLUCOSE 169*  --  154*  --  101*  BUN 20  --  22  --  17  CREATININE 1.08*  --  0.90 1.10* 0.98  CALCIUM 9.0  --   --   --  8.5*  ALKPHOS 78  --   --   --   --   AST 21  --   --   --   --   ALT 15  --   --   --   --   ALBUMIN 3.7  --   --   --   --  CARDIAC CATHETERIZATION  Result Date: 05/31/2022   The left ventricular systolic function is normal.   LV end diastolic pressure is normal.   The left ventricular ejection fraction is 55-65% by visual estimate. CONCLUSIONS: Right dominant coronary anatomy. Normal coronary arteries. Normal LV function with LVEDP 14 mmHg. Elevated troponin not explained by obstructive coronary disease.  Suspect demand ischemia/type II injury in setting of palpitations and likely sustained arrhythmia. RECOMMENDATIONS: Consider long-term monitoring. Okay to discontinue IV heparin from coronary standpoint.   ECHOCARDIOGRAM COMPLETE  Result Date: 05/31/2022    ECHOCARDIOGRAM REPORT   [name] Date of Exam: 05/31/2022 Medical Rec #:  [id number]      Height:        69.0 in Accession #:    [id number]     Weight:       204.0 lb Date of Birth:  07/03/1951       BSA:          2.083 m Patient Age:    57 years       BP:           162/70 mmHg Patient Gender: F              HR:           70 bpm. Exam Location:  Inpatient Procedure: 2D Echo, Cardiac Doppler and Color Doppler Indications:    Elevated troponin  History:        Patient has no prior history of Echocardiogram examinations.                 Risk Factors:Hypertension.  Sonographer:    Jefferey Pica Referring Phys: [id number] CARINA M BROWN IMPRESSIONS  1. Left ventricular ejection fraction, by estimation, is 60 to 65%. The left ventricle has normal function. The left ventricle has no regional wall motion abnormalities. Left ventricular diastolic parameters are consistent with Grade I diastolic dysfunction (impaired relaxation).  2. Right ventricular systolic function is normal. The right ventricular size is normal.  3. The mitral valve is normal in structure. Mild mitral valve regurgitation. No evidence of mitral stenosis.  4. The aortic valve is normal in structure. Aortic valve regurgitation is not visualized. No aortic stenosis is present.  5. The inferior vena cava is normal in size with greater than 50% respiratory variability, suggesting right atrial pressure of 3 mmHg. FINDINGS  Left Ventricle: Left ventricular ejection fraction, by estimation, is 60 to 65%. The left ventricle has normal function. The left ventricle has no regional wall motion abnormalities. The left ventricular internal cavity size was normal in size. There is  no left ventricular hypertrophy. Left ventricular diastolic parameters are consistent with Grade I diastolic dysfunction (impaired relaxation). Right Ventricle: The right ventricular size is normal. No increase in right ventricular wall thickness. Right ventricular systolic function is normal. Left Atrium: Left atrial size was normal in size. Right Atrium: Right atrial size was normal in size.  Pericardium: There is no evidence of pericardial effusion. Mitral Valve: The mitral valve is normal in structure. Mild mitral valve regurgitation. No evidence of mitral valve stenosis. Tricuspid Valve: The tricuspid valve is normal in structure. Tricuspid valve regurgitation is not demonstrated. No evidence of tricuspid stenosis. Aortic Valve: The aortic valve is normal in structure. Aortic valve regurgitation is not visualized. No aortic stenosis is present. Aortic valve peak gradient measures 9.3 mmHg. Pulmonic Valve: The pulmonic valve was normal in structure. Pulmonic valve regurgitation is not visualized. No evidence of  pulmonic stenosis. Aorta: The aortic root is normal in size and structure. Venous: The inferior vena cava is normal in size with greater than 50% respiratory variability, suggesting right atrial pressure of 3 mmHg. IAS/Shunts: No atrial level shunt detected by color flow Doppler.  LEFT VENTRICLE PLAX 2D LVIDd:         5.30 cm   Diastology LVIDs:         3.30 cm   LV e' medial:    7.77 cm/s LV PW:         1.00 cm   LV E/e' medial:  8.0 LV IVS:        0.90 cm   LV e' lateral:   7.38 cm/s LVOT diam:     1.90 cm   LV E/e' lateral: 8.4 LV SV:         72 LV SV Index:   34 LVOT Area:     2.84 cm  RIGHT VENTRICLE             IVC RV Basal diam:  2.80 cm     IVC diam: 1.80 cm RV S prime:     15.50 cm/s TAPSE (M-mode): 2.0 cm LEFT ATRIUM             Index        RIGHT ATRIUM           Index LA diam:        3.40 cm 1.63 cm/m   RA Area:     19.70 cm LA Vol (A2C):   54.2 ml 26.01 ml/m  RA Volume:   55.50 ml  26.64 ml/m LA Vol (A4C):   35.3 ml 16.94 ml/m LA Biplane Vol: 45.8 ml 21.98 ml/m  AORTIC VALVE                 PULMONIC VALVE AV Area (Vmax): 1.95 cm     PV Vmax:       0.87 m/s AV Vmax:        152.50 cm/s  PV Peak grad:  3.0 mmHg AV Peak Grad:   9.3 mmHg LVOT Vmax:      105.00 cm/s LVOT Vmean:     65.000 cm/s LVOT VTI:       0.253 m  AORTA Ao Root diam: 3.40 cm Ao Asc diam:  3.20 cm MITRAL VALVE  MV Area (PHT): 6.90 cm    SHUNTS MV Decel Time: 110 msec    Systemic VTI:  0.25 m MV E velocity: 61.80 cm/s  Systemic Diam: 1.90 cm MV A velocity: 80.60 cm/s MV E/A ratio:  0.77 Mihai Croitoru MD Electronically signed by Sanda Klein MD Signature Date/Time: 05/31/2022/3:03:59 PM    Final    CT Angio Chest PE W and/or Wo Contrast  Result Date: 05/31/2022 CLINICAL DATA:  Pulmonary embolism (PE) suspected, positive D-dimer. Weakness, dizziness EXAM: CT ANGIOGRAPHY CHEST WITH CONTRAST TECHNIQUE: Multidetector CT imaging of the chest was performed using the standard protocol during bolus administration of intravenous contrast. Multiplanar CT image reconstructions and MIPs were obtained to evaluate the vascular anatomy. RADIATION DOSE REDUCTION: This exam was performed according to the departmental dose-optimization program which includes automated exposure control, adjustment of the mA and/or kV according to patient size and/or use of iterative reconstruction technique. CONTRAST:  61m OMNIPAQUE IOHEXOL 350 MG/ML SOLN COMPARISON:  None Available. FINDINGS: Cardiovascular: Opacification of the pulmonary arterial tree is slightly suboptimal and there is limited evaluation of the segmental and subsegmental pulmonary arterial tree. No  central intraluminal filling defect identified through the level of the lobar pulmonary arteries. The central pulmonary arteries are of normal caliber. No significant coronary artery calcification. Global cardiac size within normal limits. No pericardial effusion. Mild atherosclerotic calcification within the thoracic aorta. No aortic aneurysm. Mediastinum/Nodes: 16 mm nodule within the left thyroid gland, not well characterized on this examination. No pathologic thoracic adenopathy. The esophagus is unremarkable. Lungs/Pleura: Mild emphysema. Bronchial wall thickening and scattered airway impaction peripherally is present in keeping with airway inflammation. No confluent pulmonary  infiltrate. 5 mm subpleural pulmonary nodules are identified within the right lower lobe at axial image # 92 and # 95, series 6. These are indeterminate. No central obstructing lesion. No pneumothorax or pleural effusion. Upper Abdomen: Cholecystectomy has been performed. No acute abnormality. Musculoskeletal: No acute bone abnormality. No lytic or blastic bone lesion. Review of the MIP images confirms the above findings. IMPRESSION: 1. No central pulmonary embolus. Limited evaluation of the segmental and subsegmental pulmonary arterial tree. 2. Mild emphysema. 3. Bronchial wall thickening and scattered airway impaction in keeping with airway inflammation. No confluent pulmonary infiltrate. 4. 5 mm subpleural pulmonary nodules within the right lower lobe, indeterminate. No follow-up needed if patient is low-risk (and has no known or suspected primary neoplasm). Non-contrast chest CT can be considered in 12 months if patient is high-risk. This recommendation follows the consensus statement: Guidelines for Management of Incidental Pulmonary Nodules Detected on CT Images: From the Fleischner Society 2017; Radiology 2017; 284:228-243. 5. 16 mm nodule within the left thyroid gland, not well characterized on this examination. Recommend thyroid US (ref: J Am Coll Radiol. 2015 Feb;12(2): 143-50). Emphysema (ICD10-J43.9). Electronically Signed   By: Fidela Salisbury M.D.   On: 05/31/2022 02:15   DG Chest Port 1 View  Result Date: 05/31/2022 CLINICAL DATA:  sob EXAM: PORTABLE CHEST 1 VIEW COMPARISON:  Chest x-ray 10/25/2017 FINDINGS: The heart and mediastinal contours are unchanged given AP portable technique. Aortic calcification. No focal consolidation. No pulmonary edema. No pleural effusion. No pneumothorax. No acute osseous abnormality. IMPRESSION: 1. No active disease. 2.  Aortic Atherosclerosis (ICD10-I70.0). Electronically Signed   By: Iven Finn M.D.   On: 05/31/2022 00:07   VAS [place] LOWER EXT ART SEG MULTI  (SEGMENTALS & LE RAYNAUDS)  Result Date: 05/27/2022  LOWER EXTREMITY DOPPLER STUDY Patient Name:  Andrea Robles  Date of Exam:   05/27/2022 Medical Rec #: [id number]       Accession #:    [id number] Date of Birth: [birth year]        Patient Gender: F Patient Age:   8 years Exam Location:  [facility] Procedure:      VAS [place] LOWER EXT ART SEG MULTI (SEGMENTALS & LE RAYNAUDS) Referring Phys: Gwyndolyn Saxon HENSEL --------------------------------------------------------------------------------  Indications: Claudication. High Risk Factors: Hypertension, hyperlipidemia, past history of smoking. Other Factors: Patient complains of achy legs after walking for about five                minutes.  Performing Technologist: Wilkie Aye RVT  Examination Guidelines: A complete evaluation includes at minimum, Doppler waveform signals and systolic blood pressure reading at the level of bilateral brachial, anterior tibial, and posterior tibial arteries, when vessel segments are accessible. Bilateral testing is considered an integral part of a complete examination. Photoelectric Plethysmograph (PPG) waveforms and toe systolic pressure readings are included as required and additional duplex testing as needed. Limited examinations for reoccurring indications may be performed as noted.  ABI Findings: +---------+------------------+-----+--------+--------+ Right  Rt Pressure (mmHg)IndexWaveformComment  +---------+------------------+-----+--------+--------+ Brachial 161                                     +---------+------------------+-----+--------+--------+ CFA                             biphasic         +---------+------------------+-----+--------+--------+ Popliteal                       biphasic         +---------+------------------+-----+--------+--------+ PTA      158               0.98 biphasic         +---------+------------------+-----+--------+--------+ PERO     151               0.94 biphasic          +---------+------------------+-----+--------+--------+ DP       147               0.91 biphasic         +---------+------------------+-----+--------+--------+ +---------+------------------+-----+--------+-------+ Left     Lt Pressure (mmHg)IndexWaveformComment +---------+------------------+-----+--------+-------+ Brachial 160                                    +---------+------------------+-----+--------+-------+ CFA                             biphasic        +---------+------------------+-----+--------+-------+ Popliteal                       biphasic        +---------+------------------+-----+--------+-------+ PTA      143               0.89 biphasic        +---------+------------------+-----+--------+-------+ PERO     128               0.80 biphasic        +---------+------------------+-----+--------+-------+ DP       139               0.86 biphasic        +---------+------------------+-----+--------+-------+   Summary: Right: Resting right ankle-brachial index is within normal range. No evidence of significant right lower extremity arterial disease. Left: Resting left ankle-brachial index indicates mild left lower extremity arterial disease.  Suggest a lower arterial duplex to be ordered.  *See table(s) above for measurements and observations.  Vascular consult recommended. Electronically signed by Larae Grooms MD on 05/27/2022 at 10:21:02 PM.    Final    MM 3D SCREEN BREAST BILATERAL  Result Date: 05/27/2022 CLINICAL DATA:  Screening. EXAM: DIGITAL SCREENING BILATERAL MAMMOGRAM WITH TOMOSYNTHESIS AND CAD TECHNIQUE: Bilateral screening digital craniocaudal and mediolateral oblique mammograms were obtained. Bilateral screening digital breast tomosynthesis was performed. The images were evaluated with computer-aided detection. COMPARISON:  Previous exam(s). ACR Breast Density Category b: There are scattered areas of fibroglandular density. FINDINGS: There are  no findings suspicious for malignancy. IMPRESSION: No mammographic evidence of malignancy. A result letter of this screening mammogram will be mailed directly to the patient. RECOMMENDATION: Screening mammogram in one year. (Code:SM-B-01Y) BI-RADS CATEGORY  1: Negative.  Electronically Signed   By: Nolon Nations M.D.   On: 05/27/2022 14:00   XR HIP UNILAT W OR W/O PELVIS 1V RIGHT  Result Date: 05/25/2022 An AP pelvis and lateral right hip shows no acute findings.  The hip joint space is well-maintained.    Results/Tests Pending at Time of Discharge: None  Discharge Medications:  Allergies as of 06/01/2022       Reactions   Statins Other (See Comments)   Myalgias with pravastatin, simvastatin, rosuvastatin. Able to tolerate Atorvastatin   Colcrys [colchicine] Diarrhea   Hydrocodone Other (See Comments)   Tachycardia   Flagyl [metronidazole] Itching, Rash   Flexeril [cyclobenzaprine] Other (See Comments)   Drowsiness    Pamelor [nortriptyline] Other (See Comments)   Drowsiness    Pepcid [famotidine] Itching, Rash        Medication List     TAKE these medications    atorvastatin 20 MG tablet Commonly known as: LIPITOR Take 1 tablet (20 mg total) by mouth daily.   celecoxib 200 MG capsule Commonly known as: CELEBREX Take 1 capsule (200 mg total) by mouth 2 (two) times daily between meals as needed.   cetirizine 10 MG tablet Commonly known as: ZYRTEC Take 10 mg by mouth daily.   diphenhydrAMINE 50 MG tablet Commonly known as: BENADRYL Take 50 mg by mouth at bedtime as needed for allergies.   LORazepam 0.5 MG tablet Commonly known as: ATIVAN Take 1 tablet by mouth twice daily as needed for anxiety What changed:  when to take this reasons to take this additional instructions   losartan 100 MG tablet Commonly known as: COZAAR Take 1 tablet (100 mg total) by mouth at bedtime.   metoprolol tartrate 25 MG tablet Commonly known as: LOPRESSOR Take 1 tablet (25 mg  total) by mouth 2 (two) times daily.        Discharge Instructions: Please refer to Patient Instructions section of EMR for full details.  Patient was counseled important signs and symptoms that should prompt return to medical care, changes in medications, dietary instructions, activity restrictions, and follow up appointments.   Follow-Up Appointments:   Alen Bleacher, MD 06/01/2022, 11:44 AM PGY-2, [place]

## 2022-06-02 LAB — LIPOPROTEIN A (LPA): Lipoprotein (a): 37.1 nmol/L — ABNORMAL HIGH (ref <75.0)

## 2022-06-06 ENCOUNTER — Other Ambulatory Visit: Payer: Medicare Other

## 2022-06-06 DIAGNOSIS — R002 Palpitations: Secondary | ICD-10-CM

## 2022-06-13 ENCOUNTER — Encounter: Payer: Self-pay | Admitting: Physician Assistant

## 2022-06-13 ENCOUNTER — Ambulatory Visit: Payer: Medicare Other | Admitting: Physician Assistant

## 2022-06-13 VITALS — BP 154/80 | HR 68 | Ht 69.0 in | Wt 204.8 lb

## 2022-06-13 DIAGNOSIS — I10 Essential (primary) hypertension: Secondary | ICD-10-CM | POA: Diagnosis not present

## 2022-06-13 DIAGNOSIS — R002 Palpitations: Secondary | ICD-10-CM | POA: Diagnosis not present

## 2022-06-13 DIAGNOSIS — E785 Hyperlipidemia, unspecified: Secondary | ICD-10-CM | POA: Diagnosis not present

## 2022-06-13 DIAGNOSIS — R778 Other specified abnormalities of plasma proteins: Secondary | ICD-10-CM

## 2022-06-13 DIAGNOSIS — R7989 Other specified abnormal findings of blood chemistry: Secondary | ICD-10-CM

## 2022-06-13 NOTE — Progress Notes (Unsigned)
Cardiology Office Note:    Date:  06/15/2022   ID:  ALAHIA Robles, DOB 1951-03-29, MRN [id number]  PCP:  Andrea Resides, MD   [place] [facility] Cardiologist:  Andrea Ruths, MD     Referring MD: Andrea Resides, MD   Chief Complaint  Patient presents with   Follow-up    Seen for Dr. Stanford Robles    History of Present Illness:    Andrea Robles is a 71 y.o. female with a hx of hypertension, hyperlipidemia, family history of CAD who was recently admitted to the hospital on 05/31/2022 with heart fluttering sensation.  Episode lasted less than 10 seconds however she became lightheaded and dizzy with a fluttering.  EKG and telemetry did not show significant arrhythmia.  Troponin was elevated 12-->231, she did not have significant coronary calcification noted on CT.  She has no prior history of exertional chest pain.  Echocardiogram obtained on 05/31/2022 showed EF 60 to 65%, grade 1 DD, mild MR.  Subsequent cardiac catheterization performed on the same day showed right dominant coronary system with normal coronary arteries, LVEDP 14 mmHg, EF 55 to 65%.  Patient was ultimately discharged on 06/01/2022 with recommendation of 2 weeks heart monitor.  It was recommended she follow-up with PCP regarding thyroid nodule and lung nodule seen on CT.  Patient presents today for follow-up.  She denies any further chest pain or shortness of breath.  She is wearing the heart monitor.  She does describe bilateral lower extremity weakness, worse on the left side.  Recent ABI shows normal right ABI, mild left lower extremity arterial disease.  She says she had bilateral leg pain and weakness for 2 days prior to going to the hospital.  Since then, she has not had any further issues.  PCP is planning to order arterial Doppler at some point based on phone note.  I recommended 4 weeks follow-up to review the result of the heart monitor and a 4 to 71-monthfollow-up with Dr. CStanford Robles  Past Medical History:   Diagnosis Date   Allergy    Hypercholesteremia    Hypertension     Past Surgical History:  Procedure Laterality Date   ABDOMINAL HYSTERECTOMY     CHOLECYSTECTOMY     COLONOSCOPY  01/19/2015   LEFT HEART CATH AND CORONARY ANGIOGRAPHY N/A 05/31/2022   Procedure: LEFT HEART CATH AND CORONARY ANGIOGRAPHY;  Surgeon: SBelva Crome MD;  Location: [facility];  Service: Cardiovascular;  Laterality: N/A;   POLYPECTOMY      Current Medications: Current Meds  Medication Sig   atorvastatin (LIPITOR) 20 MG tablet Take 1 tablet (20 mg total) by mouth daily.   celecoxib (CELEBREX) 200 MG capsule Take 1 capsule (200 mg total) by mouth 2 (two) times daily between meals as needed.   cetirizine (ZYRTEC) 10 MG tablet Take 10 mg by mouth daily.   diphenhydrAMINE (BENADRYL) 50 MG tablet Take 50 mg by mouth at bedtime as needed for allergies.   LORazepam (ATIVAN) 0.5 MG tablet Take 1 tablet by mouth twice daily as needed for anxiety (Patient taking differently: Take 0.5 mg by mouth as needed for anxiety. Patient uses about once a month)   losartan (COZAAR) 100 MG tablet Take 1 tablet (100 mg total) by mouth at bedtime.   metoprolol tartrate (LOPRESSOR) 25 MG tablet Take 1 tablet (25 mg total) by mouth 2 (two) times daily.     Allergies:   Statins, Colcrys [colchicine], Hydrocodone, Flagyl [metronidazole],  Flexeril [cyclobenzaprine], Pamelor [nortriptyline], and Pepcid [famotidine]   Social History   Socioeconomic History   Marital status: Married    Spouse name: Not on file   Number of children: Not on file   Years of education: Not on file   Highest education level: Not on file  Occupational History   Not on file  Tobacco Use   Smoking status: Former    Packs/day: 0.50    Years: 44.00    Total pack years: 22.00    Types: Cigarettes    Quit date: 03/27/2012    Years since quitting: 10.2   Smokeless tobacco: Never  Vaping Use   Vaping Use: Never used  Substance and Sexual Activity    Alcohol use: Yes    Comment: ocassionally-rare   Drug use: No   Sexual activity: Never  Other Topics Concern   Not on file  Social History Narrative   Not on file   Social Determinants of Health   Financial Resource Strain: Not on file  Food Insecurity: Not on file  Transportation Needs: Not on file  Physical Activity: Not on file  Stress: Not on file  Social Connections: Not on file     Family History: The patient's family history includes Colon cancer in her maternal grandfather; Colon cancer (age of onset: 72) in her brother; Colon cancer (age of onset: 43) in her sister; Lung cancer in her sister; Other in her maternal grandfather. There is no history of Esophageal cancer, Rectal cancer, or Stomach cancer.  ROS:   Please see the history of present illness.     All other systems reviewed and are negative.  EKGs/Labs/Other Studies Reviewed:    The following studies were reviewed today:  Cath 05/31/2022   The left ventricular systolic function is normal.   LV end diastolic pressure is normal.   The left ventricular ejection fraction is 55-65% by visual estimate.   CONCLUSIONS: Right dominant coronary anatomy. Normal coronary arteries. Normal LV function with LVEDP 14 mmHg. Elevated troponin not explained by obstructive coronary disease.  Suspect demand ischemia/type II injury in setting of palpitations and likely sustained arrhythmia. RECOMMENDATIONS: Consider long-term monitoring. Okay to discontinue IV heparin from coronary standpoint.  EKG:  EKG is not ordered today.    Recent Labs: 05/09/2022: TSH 0.837 05/30/2022: ALT 15; B Natriuretic Peptide 185.8 05/31/2022: Hemoglobin 13.9; Platelets 238 06/01/2022: BUN 17; Creatinine, Ser 0.98; Potassium 3.8; Sodium 140  Recent Lipid Panel    Component Value Date/Time   CHOL 163 05/09/2022 1235   TRIG 153 (H) 05/09/2022 1235   HDL 45 05/09/2022 1235   CHOLHDL 3.6 05/09/2022 1235   CHOLHDL 4.5 07/13/2016 1119   VLDL 29  07/13/2016 1119   LDLCALC 91 05/09/2022 1235   LDLDIRECT 89 04/27/2017 1001   LDLDIRECT 146 (H) 11/03/2016 1039     Risk Assessment/Calculations:           Physical Exam:    VS:  BP (!) 154/80   Pulse 68   Ht '5\' 9"'$  (1.753 m)   Wt 204 lb 12.8 oz (92.9 kg)   SpO2 95%   BMI 30.24 kg/m        Wt Readings from Last 3 Encounters:  06/13/22 204 lb 12.8 oz (92.9 kg)  05/30/22 204 lb (92.5 kg)  05/09/22 204 lb 9.6 oz (92.8 kg)     GEN: Well nourished, well developed in no acute distress HEENT: Normal NECK: No JVD; No carotid bruits LYMPHATICS: No lymphadenopathy CARDIAC: RRR,  no murmurs, rubs, gallops RESPIRATORY:  Clear to auscultation without rales, wheezing or rhonchi  ABDOMEN: Soft, non-tender, non-distended MUSCULOSKELETAL:  No edema; No deformity  SKIN: Warm and dry NEUROLOGIC:  Alert and oriented x 3 PSYCHIATRIC:  Normal affect   ASSESSMENT:    1. Palpitations   2. Primary hypertension   3. Hyperlipidemia, unspecified hyperlipidemia type   4. Elevated troponin    PLAN:    In order of problems listed above:  Palpitation: Patient is currently wearing a heart monitor.  We will await for heart monitor results  Hypertension: Blood pressure stable on current therapy  Hyperlipidemia: On Lipitor  Elevated troponin: Occurred recently in the setting of palpitation.  She is currently wearing a heart monitor to rule out significant arrhythmia.  Recent cardiac catheterization showed clean coronary arteries.  Suspect recent troponin elevation was due to demand ischemia.  Continue metoprolol tartrate           Medication Adjustments/Labs and Tests Ordered: Current medicines are reviewed at length with the patient today.  Concerns regarding medicines are outlined above.  No orders of the defined types were placed in this encounter.  No orders of the defined types were placed in this encounter.   Patient Instructions  Medication Instructions:  Your physician  recommends that you continue on your current medications as directed. Please refer to the Current Medication list given to you today.  *If you need a refill on your cardiac medications before your next appointment, please call your pharmacy*  Lab Work: NONE ordered at this time of appointment   If you have labs (blood work) drawn today and your tests are completely normal, you will receive your results only by: [place] (if you have MyChart) OR A paper copy in the mail If you have any lab test that is abnormal or we need to change your treatment, we will call you to review the results.  Testing/Procedures: NONE ordered at this time of appointment   Follow-Up: At [facility], you and your health needs are our priority.  As part of our continuing mission to provide you with exceptional heart care, we have created designated Provider Care Teams.  These Care Teams include your primary Cardiologist (physician) and Advanced Practice Providers (APPs -  Physician Assistants and Nurse Practitioners) who all work together to provide you with the care you need, when you need it.  Your next appointment:   4 week(s)  The format for your next appointment:   In Person  Provider:   APP    Then, Andrea Ruths, MD will plan to see you again in 4-7 month(s).   Other Instructions   Important Information About Sugar         Hilbert Corrigan, [place]  06/15/2022 11:46 PM    [place]

## 2022-06-13 NOTE — Patient Instructions (Signed)
Medication Instructions:  Your physician recommends that you continue on your current medications as directed. Please refer to the Current Medication list given to you today.  *If you need a refill on your cardiac medications before your next appointment, please call your pharmacy*  Lab Work: NONE ordered at this time of appointment   If you have labs (blood work) drawn today and your tests are completely normal, you will receive your results only by: [place] (if you have MyChart) OR A paper copy in the mail If you have any lab test that is abnormal or we need to change your treatment, we will call you to review the results.  Testing/Procedures: NONE ordered at this time of appointment   Follow-Up: At [facility], you and your health needs are our priority.  As part of our continuing mission to provide you with exceptional heart care, we have created designated Provider Care Teams.  These Care Teams include your primary Cardiologist (physician) and Advanced Practice Providers (APPs -  Physician Assistants and Nurse Practitioners) who all work together to provide you with the care you need, when you need it.  Your next appointment:   4 week(s)  The format for your next appointment:   In Person  Provider:   APP    Then, Kirk Ruths, MD will plan to see you again in 4-7 month(s).   Other Instructions   Important Information About Sugar

## 2022-06-15 ENCOUNTER — Encounter: Payer: Self-pay | Admitting: Physician Assistant

## 2022-06-20 ENCOUNTER — Encounter: Payer: Self-pay | Admitting: Family Medicine

## 2022-06-20 ENCOUNTER — Ambulatory Visit (INDEPENDENT_AMBULATORY_CARE_PROVIDER_SITE_OTHER): Payer: Medicare Other | Admitting: Family Medicine

## 2022-06-20 DIAGNOSIS — E041 Nontoxic single thyroid nodule: Secondary | ICD-10-CM | POA: Diagnosis not present

## 2022-06-20 DIAGNOSIS — R0609 Other forms of dyspnea: Secondary | ICD-10-CM

## 2022-06-20 DIAGNOSIS — R911 Solitary pulmonary nodule: Secondary | ICD-10-CM | POA: Diagnosis not present

## 2022-06-20 DIAGNOSIS — I739 Peripheral vascular disease, unspecified: Secondary | ICD-10-CM | POA: Diagnosis not present

## 2022-06-20 DIAGNOSIS — I10 Essential (primary) hypertension: Secondary | ICD-10-CM

## 2022-06-20 NOTE — Assessment & Plan Note (Signed)
Brings in a cormprehensive list of home BPs.  All good control.  Of late, most readings are lowish.  No change

## 2022-06-20 NOTE — Assessment & Plan Note (Signed)
>>  ASSESSMENT AND PLAN FOR HYPERTENSION WRITTEN ON 06/20/2022  4:32 PM BY HENSEL, Santiago Bumpers, MD  Brings in a cormprehensive list of home BPs.  All good control.  Of late, most readings are lowish.  No change

## 2022-06-20 NOTE — Progress Notes (Signed)
    SUBJECTIVE:   CHIEF COMPLAINT / HPI:   FU hospitalization.  Had a spell where she felt extremely weak and SOB.  CTA chest neg.   Husband called EMS.  Trop bumped.  Followed by a normal cath.  Currently wearing a monitor patch.  Has cards FU planned.   Incidental findings included a thyroid nodule and a pulmonary nodule She feels great - back to normal.   Only symptom is occasional palpitations.  They are frequent enough that we should catch with the Zio patch. Also had ABIs which suggest mild left side obstruction.  She is asymptomatic at this time.  Her bilateral calf pain has resolved.    OBJECTIVE:   BP (!) 154/70   Pulse (!) 56   Ht '5\' 9"'$  (1.753 m)   Wt 206 lb (93.4 kg)   SpO2 100%   BMI 30.42 kg/m   Lungs clear Cardiac RRR without m or g BP noted.  Good home BP readings offset this mild elevation.  ASSESSMENT/PLAN:   Pulmonary nodule Incidental finding unrelated to current symptoms.  Thyroid nodule Normal TSH 04/2022.  Needs FU ultrasound  Hypertension Brings in a cormprehensive list of home BPs.  All good control.  Of late, most readings are lowish.  No change  Dyspnea Unclear cause of acute dyspnea and bump in trop.  Only pending test is 2 week cards monitor.  No change until I see the result.    PVD (peripheral vascular disease) (HCC) Mild left sided obstruction.  (But fully clean coronaries). She is asymptomatic.  Recommend continue ASA.  On statin and at goal.       Andrea Robles, [place]

## 2022-06-20 NOTE — Assessment & Plan Note (Signed)
Normal TSH 04/2022.  Needs FU ultrasound

## 2022-06-20 NOTE — Assessment & Plan Note (Signed)
Incidental finding unrelated to current symptoms.

## 2022-06-20 NOTE — Assessment & Plan Note (Signed)
Mild left sided obstruction.  (But fully clean coronaries). She is asymptomatic.  Recommend continue ASA.  On statin and at goal.

## 2022-06-20 NOTE — Patient Instructions (Signed)
Your recent blood pressures are good or on the slightly low side.  In the long run, especially if the lightheadedness continues, Im may want to decrease your blood pressure pills.  I do not want to do anything until we get the results of the heart monitor.   Whatever that spell was, I hope it doesn't come back.  As of this moment, I do not have a good explanation for the spell you had.   Stay on the aspirin 81 mg until we have the monitor results.

## 2022-06-20 NOTE — Assessment & Plan Note (Signed)
Unclear cause of acute dyspnea and bump in trop.  Only pending test is 2 week cards monitor.  No change until I see the result.

## 2022-06-24 DIAGNOSIS — R002 Palpitations: Secondary | ICD-10-CM | POA: Diagnosis not present

## 2022-07-06 ENCOUNTER — Telehealth: Payer: Self-pay | Admitting: Family Medicine

## 2022-07-06 DIAGNOSIS — I471 Supraventricular tachycardia: Secondary | ICD-10-CM | POA: Insufficient documentation

## 2022-07-06 NOTE — Telephone Encounter (Signed)
Called patient.  Long term heart monitor showed episodes of SVT which probably explain her spells.  My be tough to treat since also recorded low heart rate of 46.  Could this be early sick sinus node syndrome.  Has cards FU appointment on 9/16.  She will make an appointment with me post cards visit.

## 2022-07-11 ENCOUNTER — Ambulatory Visit: Payer: Medicare Other | Admitting: Physician Assistant

## 2022-07-12 NOTE — Progress Notes (Unsigned)
Cardiology Clinic Note   Patient Name: Andrea Robles Date of Encounter: 07/14/2022  Primary Care Provider:  Zenia Resides, MD Primary Cardiologist:  Kirk Ruths, MD  Patient Profile     Andrea Robles 71 year old female presents to the clinic today for follow-up evaluation of her palpitations.  Past Medical History    Past Medical History:  Diagnosis Date   Allergy    Hypercholesteremia    Hypertension    Past Surgical History:  Procedure Laterality Date   ABDOMINAL HYSTERECTOMY     CHOLECYSTECTOMY     COLONOSCOPY  01/19/2015   LEFT HEART CATH AND CORONARY ANGIOGRAPHY N/A 05/31/2022   Procedure: LEFT HEART CATH AND CORONARY ANGIOGRAPHY;  Surgeon: Belva Crome, MD;  Location: [facility];  Service: Cardiovascular;  Laterality: N/A;   POLYPECTOMY      Allergies  Allergies  Allergen Reactions   Statins Other (See Comments)    Myalgias with pravastatin, simvastatin, rosuvastatin. Able to tolerate Atorvastatin   Colcrys [Colchicine] Diarrhea   Hydrocodone Other (See Comments)    Tachycardia   Flagyl [Metronidazole] Itching and Rash   Flexeril [Cyclobenzaprine] Other (See Comments)    Drowsiness    Pamelor [Nortriptyline] Other (See Comments)    Drowsiness    Pepcid [Famotidine] Itching and Rash    History of Present Illness     Andrea Robles has a PMH of HLD, HTN, palpitations, and family history of coronary artery disease.  She was admitted to the hospital 05/31/2022 with a fluttering sensation.  She reported episodes would last around 10 seconds which were called her to become lightheaded and feel dizzy.  Her EKG showed significant arrhythmia.  Her troponins were elevated at 12 and 231.  She was noted to have significant coronary calcification on CT.  She denied a history of chest discomfort.  Her echocardiogram 05/31/2022 showed an EF of 60-65%, G1 DD, mild MR.  She underwent cardiac catheterization on the same day which showed normal RCA and normal  coronary anatomy.  Her LVEDP was 14 and EF noted to be 55-65%.  She was discharged on 06/01/2022.  A cardiac event monitor was recommended.  It was also recommended that she follow-up with her PCP due to her thyroid nodule and lung nodule that was seen on CT.  She was seen by Almyra Deforest PA-C on 06/13/2022.  During that time she denied further chest pain or shortness of breath.  She was wearing her cardiac event monitor.  She described bilateral lower extremity weakness which was worse on the left side.  Her ABIs showed normal right ABI and mild left lower extremity arterial disease.  She reported bilateral leg pain and weakness for 2 days prior to going to the hospital.  She denied further issues.  Her PCP was planning to order an arterial Doppler.  It was recommended that she follow-up after cardiac event monitor.  Her cardiac event monitor 06/28/2029 showed a minimum heart rate of 46, maximum heart rate of 176 and an average heart rate of 66 bpm.  This showed predominantly normal sinus rhythm.  That showed 436 episodes of supraventricular tachycardia with the fastest interval lasting 5 beats with a maximum heart rate of 176.  Her longest episode was 17 beats with an average heart rate of 104 bpm.  SVT was detected within 45 seconds of patient triggered events.  She was also noted to have rare SVE's, sinus bradycardia, normal sinus rhythm, sinus tachycardia, occasional PACs, brief  PAT and rare PVCs.  It was recommended that she continue on metoprolol.  She presents to the clinic today for follow-up evaluation states she continues to have occasional episodes of increased heart rate.  She has been avoiding triggers for palpitations.  She does note some dizziness with bending over.  We reviewed her cardiac event monitor and she expressed understanding.  She brings in a blood pressure log today that shows fairly well-controlled blood pressures.  Her STOP-BANG score is 4 and her Epworth is 1.  Her husband reports that  she occasionally has low sounding snoring.  She is noticing that her losartan has changed color and is now giving her indigestion when she takes it before bed.  I will send prescription for branded Cozaar, dispense as prescribed/written.  I will give her the [place] support stocking sheet, salty 6 diet sheet, have her maintain her physical activity  Today she he denies chest pain, shortness of breath, lower extremity edema, fatigue, melena, hematuria, hemoptysis, diaphoresis, weakness, presyncope, syncope, orthopnea, and PND.    Home Medications    Prior to Admission medications   Medication Sig Start Date End Date Taking? Authorizing Provider  atorvastatin (LIPITOR) 20 MG tablet Take 1 tablet (20 mg total) by mouth daily. 12/20/21   Zenia Resides, MD  celecoxib (CELEBREX) 200 MG capsule Take 1 capsule (200 mg total) by mouth 2 (two) times daily between meals as needed. 05/25/22   Mcarthur Rossetti, MD  cetirizine (ZYRTEC) 10 MG tablet Take 10 mg by mouth daily.    [provider]  diphenhydrAMINE (BENADRYL) 50 MG tablet Take 50 mg by mouth at bedtime as needed for allergies.    [provider]  LORazepam (ATIVAN) 0.5 MG tablet Take 1 tablet by mouth twice daily as needed for anxiety Patient taking differently: Take 0.5 mg by mouth as needed for anxiety. Patient uses about once a month 08/12/20   Zenia Resides, MD  losartan (COZAAR) 100 MG tablet Take 1 tablet (100 mg total) by mouth at bedtime. 12/20/21   Zenia Resides, MD  metoprolol tartrate (LOPRESSOR) 25 MG tablet Take 1 tablet (25 mg total) by mouth 2 (two) times daily. 12/20/21   Zenia Resides, MD    Family History    Family History  Problem Relation Age of Onset   Colon cancer Sister 67   Lung cancer Sister    Colon cancer Brother 45       METS   Other Maternal Grandfather        ? colon cancer seen on 2005 colon report   Colon cancer Maternal Grandfather    Esophageal cancer Neg Hx     Rectal cancer Neg Hx    Stomach cancer Neg Hx    She indicated that her mother is deceased. She indicated that her father is deceased. She indicated that her sister is deceased. She indicated that her brother is deceased. She indicated that the status of her maternal grandfather is unknown. She indicated that the status of her neg hx is unknown.  Social History    Social History   Socioeconomic History   Marital status: Married    Spouse name: Not on file   Number of children: Not on file   Years of education: Not on file   Highest education level: Not on file  Occupational History   Not on file  Tobacco Use   Smoking status: Former    Packs/day: 0.50    Years: 44.00  Total pack years: 22.00    Types: Cigarettes    Quit date: 03/27/2012    Years since quitting: 10.3   Smokeless tobacco: Never  Vaping Use   Vaping Use: Never used  Substance and Sexual Activity   Alcohol use: Yes    Comment: ocassionally-rare   Drug use: No   Sexual activity: Never  Other Topics Concern   Not on file  Social History Narrative   Not on file   Social Determinants of Health   Financial Resource Strain: Not on file  Food Insecurity: Not on file  Transportation Needs: Not on file  Physical Activity: Not on file  Stress: Not on file  Social Connections: Not on file  Intimate Partner Violence: Not on file     Review of Systems    General:  No chills, fever, night sweats or weight changes.  Cardiovascular:  No chest pain, dyspnea on exertion, edema, orthopnea, palpitations, paroxysmal nocturnal dyspnea. Dermatological: No rash, lesions/masses Respiratory: No cough, dyspnea Urologic: No hematuria, dysuria Abdominal:   No nausea, vomiting, diarrhea, bright red blood per rectum, melena, or hematemesis Neurologic:  No visual changes, wkns, changes in mental status. All other systems reviewed and are otherwise negative except as noted above.  Physical Exam    VS:  BP 134/66 (BP  Location: Left Arm, Patient Position: Sitting, Cuff Size: Large)   Pulse 66   Ht '5\' 9"'$  (1.753 m)   Wt 206 lb 9.6 oz (93.7 kg)   SpO2 99%   BMI 30.51 kg/m  , BMI Body mass index is 30.51 kg/m. GEN: Well nourished, well developed, in no acute distress. HEENT: normal. Neck: Supple, no JVD, carotid bruits, or masses. Cardiac: RRR, no murmurs, rubs, or gallops. No clubbing, cyanosis, edema.  Radials/DP/PT 2+ and equal bilaterally.  Respiratory:  Respirations regular and unlabored, clear to auscultation bilaterally. GI: Soft, nontender, nondistended, BS + x 4. MS: no deformity or atrophy. Skin: warm and dry, no rash. Neuro:  Strength and sensation are intact. Psych: Normal affect.  Accessory Clinical Findings    Recent Labs: 05/09/2022: TSH 0.837 05/30/2022: ALT 15; B Natriuretic Peptide 185.8 05/31/2022: Hemoglobin 13.9; Platelets 238 06/01/2022: BUN 17; Creatinine, Ser 0.98; Potassium 3.8; Sodium 140   Recent Lipid Panel    Component Value Date/Time   CHOL 163 05/09/2022 1235   TRIG 153 (H) 05/09/2022 1235   HDL 45 05/09/2022 1235   CHOLHDL 3.6 05/09/2022 1235   CHOLHDL 4.5 07/13/2016 1119   VLDL 29 07/13/2016 1119   LDLCALC 91 05/09/2022 1235   LDLDIRECT 89 04/27/2017 1001   LDLDIRECT 146 (H) 11/03/2016 1039         ECG personally reviewed by me today-none today  Echocardiogram 05/31/2022  IMPRESSIONS     1. Left ventricular ejection fraction, by estimation, is 60 to 65%. The  left ventricle has normal function. The left ventricle has no regional  wall motion abnormalities. Left ventricular diastolic parameters are  consistent with Grade I diastolic  dysfunction (impaired relaxation).   2. Right ventricular systolic function is normal. The right ventricular  size is normal.   3. The mitral valve is normal in structure. Mild mitral valve  regurgitation. No evidence of mitral stenosis.   4. The aortic valve is normal in structure. Aortic valve regurgitation is  not  visualized. No aortic stenosis is present.   5. The inferior vena cava is normal in size with greater than 50%  respiratory variability, suggesting right atrial pressure of 3 mmHg.  FINDINGS   Left Ventricle: Left ventricular ejection fraction, by estimation, is 60  to 65%. The left ventricle has normal function. The left ventricle has no  regional wall motion abnormalities. The left ventricular internal cavity  size was normal in size. There is   no left ventricular hypertrophy. Left ventricular diastolic parameters  are consistent with Grade I diastolic dysfunction (impaired relaxation).   Right Ventricle: The right ventricular size is normal. No increase in  right ventricular wall thickness. Right ventricular systolic function is  normal.   Left Atrium: Left atrial size was normal in size.   Right Atrium: Right atrial size was normal in size.   Pericardium: There is no evidence of pericardial effusion.   Mitral Valve: The mitral valve is normal in structure. Mild mitral valve  regurgitation. No evidence of mitral valve stenosis.   Tricuspid Valve: The tricuspid valve is normal in structure. Tricuspid  valve regurgitation is not demonstrated. No evidence of tricuspid  stenosis.   Aortic Valve: The aortic valve is normal in structure. Aortic valve  regurgitation is not visualized. No aortic stenosis is present. Aortic  valve peak gradient measures 9.3 mmHg.   Pulmonic Valve: The pulmonic valve was normal in structure. Pulmonic valve  regurgitation is not visualized. No evidence of pulmonic stenosis.   Aorta: The aortic root is normal in size and structure.   Venous: The inferior vena cava is normal in size with greater than 50%  respiratory variability, suggesting right atrial pressure of 3 mmHg.   IAS/Shunts: No atrial level shunt detected by color flow Doppler.  Cardiac event monitor 06/28/2022 Patch Wear Time:  14 days and 0 hours (2023-08-14T14:50:49-0400 to  2023-08-28T14:50:53-0400)   Patient had a min HR of 46 bpm, max HR of 176 bpm, and avg HR of 66 bpm. Predominant underlying rhythm was Sinus Rhythm. 436 Supraventricular Tachycardia runs occurred, the run with the fastest interval lasting 5 beats with a max rate of 176 bpm, the  longest lasting 17 beats with an avg rate of 104 bpm. Supraventricular Tachycardia was detected within +/- 45 seconds of symptomatic patient event(s). Isolated SVEs were frequent (8.2%, [id number]), SVE Couplets were rare (<1.0%, 4863), and SVE Triplets were  rare (<1.0%, 1374). Isolated VEs were rare (<1.0%), VE Couplets were rare (<1.0%), and no VE Triplets were present.    Sinus bradycardia, normal sinus rhythm, sinus tachycardia, occasional PAC, brief runs of PAT, rare PVC and couplet. Kirk Ruths MD  Cardiac catheterization 05/31/2022    The left ventricular systolic function is normal.   LV end diastolic pressure is normal.   The left ventricular ejection fraction is 55-65% by visual estimate.   CONCLUSIONS: Right dominant coronary anatomy. Normal coronary arteries. Normal LV function with LVEDP 14 mmHg. Elevated troponin not explained by obstructive coronary disease.  Suspect demand ischemia/type II injury in setting of palpitations and likely sustained arrhythmia. RECOMMENDATIONS: Consider long-term monitoring. Okay to discontinue IV heparin from coronary standpoint.   Diagnostic Dominance: Right  Intervention  Assessment & Plan   1.  Palpitations-cardiac event monitor showed no significant arrhythmias.  Monitor results were reviewed by Dr. Stanford Breed and recommendation was made for continued metoprolol. Continue with metoprolol Heart healthy low-sodium diet Increase physical activity as tolerated Avoid triggers  Essential hypertension-BP today 134/66 Continue losartan, metoprolol Heart healthy low-sodium diet-salty 6 given Increase physical activity as tolerated  Hyperlipidemia-LDL 91 Continue  atorvastatin Heart healthy low-sodium high-fiber diet Increase physical activity as tolerated  Elevated troponin-underwent cardiac catheterization during recent admission.  Noted to have normal coronary anatomy. Continue heart healthy low-sodium diet and increase physical activity as tolerated  Disposition: Follow-up with Dr. Stanford Breed in 6-9 months.   Jossie Ng. Robena Ewy NP-C     07/14/2022, 2:21 PM [facility] [place] [address] Office [phone] Fax [phone]  Notice: This dictation was prepared with Dragon dictation along with smaller phrase technology. Any transcriptional errors that result from this process are unintentional and may not be corrected upon review.  I spent 14 minutes examining this patient, reviewing medications, and using patient centered shared decision making involving her cardiac care.  Prior to her visit I spent greater than 20 minutes reviewing her past medical history,  medications, and prior cardiac tests.

## 2022-07-14 ENCOUNTER — Ambulatory Visit: Payer: Medicare Other | Attending: Physician Assistant | Admitting: General Practice

## 2022-07-14 ENCOUNTER — Encounter: Payer: Self-pay | Admitting: General Practice

## 2022-07-14 VITALS — BP 134/66 | HR 66 | Ht 69.0 in | Wt 206.6 lb

## 2022-07-14 DIAGNOSIS — E785 Hyperlipidemia, unspecified: Secondary | ICD-10-CM | POA: Diagnosis not present

## 2022-07-14 DIAGNOSIS — I10 Essential (primary) hypertension: Secondary | ICD-10-CM

## 2022-07-14 DIAGNOSIS — R778 Other specified abnormalities of plasma proteins: Secondary | ICD-10-CM | POA: Diagnosis not present

## 2022-07-14 DIAGNOSIS — R002 Palpitations: Secondary | ICD-10-CM

## 2022-07-14 NOTE — Patient Instructions (Signed)
Medication Instructions:  The current medical regimen is effective;  continue present plan and medications as directed. Please refer to the Current Medication list given to you today.  *If you need a refill on your cardiac medications before your next appointment, please call your pharmacy*  Lab Work: NONE If you have labs (blood work) drawn today and your tests are completely normal, you will receive your results only by: [place] (if you have MyChart) OR A paper copy in the mail If you have any lab test that is abnormal or we need to change your treatment, we will call you to review the results.  Testing/Procedures: NONE  Follow-Up: At [facility], you and your health needs are our priority.  As part of our continuing mission to provide you with exceptional heart care, we have created designated Provider Care Teams.  These Care Teams include your primary Cardiologist (physician) and Advanced Practice Providers (APPs -  Physician Assistants and Nurse Practitioners) who all work together to provide you with the care you need, when you need it.  Your next appointment:   6 month(s)  The format for your next appointment:   In Person  Provider:   Kirk Ruths, MD   Other Instructions PLEASE READ AND FOLLOW ATTACHED SALTY 6  INCREASE HYDRATION  Please try to avoid these triggers: Do not use any products that have nicotine or tobacco in them. These include cigarettes, e-cigarettes, and chewing tobacco. If you need help quitting, ask your doctor. Eat heart-healthy foods. Talk with your doctor about the right eating plan for you. Exercise regularly as told by your doctor. Stay hydrated Do not drink alcohol, Caffeine or chocolate. Lose weight if you are overweight. Do not use drugs, including cannabis    PLEASE PURCHASE AND WEAR COMPRESSION STOCKINGS DAILY AND TAKE OFF AT BEDTIME. Compression stockings are elastic socks that squeeze the legs. They help to increase  blood flow to the legs and to decrease swelling in the legs from fluid retention, and reduce the chance of developing blood clots in the lower legs. Please put on in the AM when dressing and off at night when dressing for bed.  LET THEM KNOW THAT YOU NEED KNEE HIGH'S WITH COMPRESSION OF 15-20 mmhg.  [employer]  [place] ([place] [phone]); [place], [place] [id number]; [phone]  EMAIL   [email or web address].  PLEASE MAKE SURE TO ELEVATE YOUR FEET & LEGS WHILE SITTING, THIS WILL HELP WITH THE SWELLING ALSO.    Important Information About Sugar

## 2022-07-15 ENCOUNTER — Other Ambulatory Visit: Payer: Self-pay | Admitting: Family Medicine

## 2022-07-15 DIAGNOSIS — I10 Essential (primary) hypertension: Secondary | ICD-10-CM

## 2022-07-21 ENCOUNTER — Ambulatory Visit (INDEPENDENT_AMBULATORY_CARE_PROVIDER_SITE_OTHER): Payer: Medicare Other | Admitting: Family Medicine

## 2022-07-21 ENCOUNTER — Encounter: Payer: Self-pay | Admitting: Family Medicine

## 2022-07-21 VITALS — BP 148/84 | HR 60 | Ht 69.0 in | Wt 206.2 lb

## 2022-07-21 DIAGNOSIS — Z23 Encounter for immunization: Secondary | ICD-10-CM | POA: Diagnosis not present

## 2022-07-21 DIAGNOSIS — E041 Nontoxic single thyroid nodule: Secondary | ICD-10-CM | POA: Diagnosis not present

## 2022-07-21 DIAGNOSIS — R911 Solitary pulmonary nodule: Secondary | ICD-10-CM

## 2022-07-21 DIAGNOSIS — I10 Essential (primary) hypertension: Secondary | ICD-10-CM

## 2022-07-21 NOTE — Patient Instructions (Addendum)
You need to have a repeat CT scan of you chest August 2024 for a very tiny, benign looking spot on your lungs seen on the 8/23 CT scan.  You also had the incidental finding of a left wing of your thyroid.  We start by ordering a thyroid ultrasound.  I will call with results.  I hope we don't need to take any more steps.  We shall see. I am happy with your blood pressure.

## 2022-07-21 NOTE — Progress Notes (Signed)
    SUBJECTIVE:   CHIEF COMPLAINT / HPI:   FU hypertension.  Brings in multiple home readings.  A few elevated systolics.  The majority are within range.  Has occasional lowish systolic.  Also has occasional lightheadedness on standing. Educated on incidental finding of Pulmonary nodule.  She knows that she is due for FU chest CT in 05/2023. Incidental finding of thyroid nodule, left lobe seen on CT scan 05/2022.  Recent TSH normal.  No neck swelling. Palpitations less.  Cards is following.      OBJECTIVE:   BP (!) 148/84   Pulse 60   Ht '5\' 9"'$  (1.753 m)   Wt 206 lb 3.2 oz (93.5 kg)   SpO2 100%   BMI 30.45 kg/m   Neck, no appreciable thyroid mass by my exam Lungs clear Cardiac RRR without m or g  ASSESSMENT/PLAN:   HYPERTENSION, BENIGN SYSTEMIC Even though today systolic elevated, no change because majority of home readings are normal.  Thyroid nodule Start with ultrasound.  If solid, will need uptake scan.    Pulmonary nodule She is aware and will do her part to remember.     Zenia Resides, MD [place]

## 2022-07-21 NOTE — Assessment & Plan Note (Signed)
Even though today systolic elevated, no change because majority of home readings are normal.

## 2022-07-21 NOTE — Assessment & Plan Note (Signed)
Start with ultrasound.  If solid, will need uptake scan.

## 2022-07-21 NOTE — Assessment & Plan Note (Signed)
She is aware and will do her part to remember.

## 2022-07-25 ENCOUNTER — Ambulatory Visit
Admission: RE | Admit: 2022-07-25 | Discharge: 2022-07-25 | Disposition: A | Discharge status: Home / Self Care | Payer: Medicare Other | Source: Ambulatory Visit | Attending: Family Medicine | Admitting: Family Medicine

## 2022-07-25 DIAGNOSIS — E041 Nontoxic single thyroid nodule: Secondary | ICD-10-CM

## 2022-08-26 ENCOUNTER — Ambulatory Visit (INDEPENDENT_AMBULATORY_CARE_PROVIDER_SITE_OTHER): Payer: Medicare Other

## 2022-08-26 DIAGNOSIS — Z23 Encounter for immunization: Secondary | ICD-10-CM | POA: Diagnosis not present

## 2022-10-13 ENCOUNTER — Other Ambulatory Visit: Payer: Self-pay

## 2022-10-13 ENCOUNTER — Telehealth: Payer: Self-pay

## 2022-10-13 ENCOUNTER — Encounter: Payer: Self-pay | Admitting: Student

## 2022-10-13 ENCOUNTER — Ambulatory Visit (INDEPENDENT_AMBULATORY_CARE_PROVIDER_SITE_OTHER): Payer: Medicare Other | Admitting: Student

## 2022-10-13 VITALS — BP 125/74 | HR 86 | Wt 212.6 lb

## 2022-10-13 DIAGNOSIS — R509 Fever, unspecified: Secondary | ICD-10-CM | POA: Diagnosis not present

## 2022-10-13 LAB — POCT INFLUENZA A/B
Influenza A, POC: NEGATIVE
Influenza B, POC: NEGATIVE

## 2022-10-13 NOTE — Telephone Encounter (Signed)
Patient calls nurse line reporting flu like symptoms.   She reports symptoms started yesterday with back pain, fevers, cold chills and nausea.   She denies any cough, congestions, headaches or SOB at this time.   She reports her grandchildren have been sick.   Patient would like to be evaluated and tested before she gets together with her family for the holiday. She reports she does not have a home covid test.   Patient scheduled for this afternoon.

## 2022-10-13 NOTE — Patient Instructions (Signed)
It was great to see you! Thank you for allowing me to participate in your care!  I recommend that you always bring your medications to each appointment as this makes it easy to ensure we are on the correct medications and helps [place] not miss when refills are needed.  Our plans for today:   Fever - Covid/Flu testing - Supportive care  We are checking some labs today, I will call you if they are abnormal will send you a MyChart message or a letter if they are normal.  If you do not hear about your labs in the next 2 weeks please let us know.  Take care and seek immediate care sooner if you develop any concerns.   Dr. Holley Bouche, MD [place]

## 2022-10-13 NOTE — Assessment & Plan Note (Signed)
Patient presents with one day of fever, HA, and diarrhea, with recent exposure to grand children who are school aged and sick. Patient also appreciates rhinorrhea and body aches. Patient physical exam normal, but is wanting testing for Covid and flu. Will test patient given the time window for Flu tx. Informed patient that she likely wouldn't need tx for Covid, as if she had it, it would be a mild case. Reassured patient that it looks like she has a virus, she should avoid company while febrile, w/ conservative management. -Flu/Covid testing -Conservative management

## 2022-10-13 NOTE — Progress Notes (Signed)
  SUBJECTIVE:   CHIEF COMPLAINT / HPI:   F/u flu like symptoms Back started hurting yesterday, this morning she felt cold w/ chills and husband thought she felt hot. Appreciates some HA and diarrhea x 5 bouts since this am, and nausea. No blood in stools.  Patient appreciates that her grand kids have been sick and have been visiting. Also appreciates rhinorrhea and body aches. Tried some tylenol w/o relief.    PERTINENT  PMH / PSH:    OBJECTIVE:  BP 125/74   Pulse 86   Wt 212 lb 9.6 oz (96.4 kg)   SpO2 98%   BMI 31.40 kg/m  Physical Exam Constitutional:      General: She is not in acute distress.    Appearance: Normal appearance. She is not ill-appearing.  HENT:     Mouth/Throat:     Mouth: Mucous membranes are moist.     Pharynx: No oropharyngeal exudate or posterior oropharyngeal erythema.  Cardiovascular:     Rate and Rhythm: Normal rate and regular rhythm.     Pulses: Normal pulses.     Heart sounds: Normal heart sounds. No murmur heard.    No friction rub. No gallop.  Pulmonary:     Effort: Pulmonary effort is normal. No respiratory distress.     Breath sounds: Normal breath sounds. No stridor. No wheezing, rhonchi or rales.  Abdominal:     General: Bowel sounds are normal. There is no distension.     Palpations: Abdomen is soft.     Tenderness: There is no abdominal tenderness. There is no guarding.  Skin:    Capillary Refill: Capillary refill takes less than 2 seconds.  Neurological:     Mental Status: She is alert.  Psychiatric:        Mood and Affect: Mood normal.        Behavior: Behavior normal.      ASSESSMENT/PLAN:  Fever, unspecified fever cause Assessment & Plan: Patient presents with one day of fever, HA, and diarrhea, with recent exposure to grand children who are school aged and sick. Patient also appreciates rhinorrhea and body aches. Patient physical exam normal, but is wanting testing for Covid and flu. Will test patient given the time window  for Flu tx. Informed patient that she likely wouldn't need tx for Covid, as if she had it, it would be a mild case. Reassured patient that it looks like she has a virus, she should avoid company while febrile, w/ conservative management. -Flu/Covid testing -Conservative management  Orders: -     POC Influenza A&B([facility]ICKV[facility]) -     Novel Coronavirus, NAA ([facility]) -     POCT Influenza A/B   No follow-ups on file. Holley Bouche, MD 10/13/2022, 3:54 PM PGY-2, [place]

## 2022-10-14 LAB — NOVEL CORONAVIRUS, NAA: SARS-CoV-2, NAA: NOT DETECTED

## 2022-11-06 ENCOUNTER — Other Ambulatory Visit: Payer: Self-pay | Admitting: Family Medicine

## 2022-11-06 DIAGNOSIS — E7800 Pure hypercholesterolemia, unspecified: Secondary | ICD-10-CM

## 2022-11-21 ENCOUNTER — Other Ambulatory Visit: Payer: Self-pay | Admitting: Family Medicine

## 2022-11-27 ENCOUNTER — Other Ambulatory Visit: Payer: Self-pay | Admitting: Family Medicine

## 2022-11-29 ENCOUNTER — Encounter: Payer: Self-pay | Admitting: Family Medicine

## 2023-01-06 NOTE — Progress Notes (Unsigned)
HPI:  Current Outpatient Medications  Medication Sig Dispense Refill   atorvastatin (LIPITOR) 20 MG tablet Take 1 tablet by mouth once daily 90 tablet 0   cetirizine (ZYRTEC) 10 MG tablet Take 10 mg by mouth daily. (Patient not taking: Reported on 07/14/2022)     diphenhydrAMINE (BENADRYL) 50 MG tablet Take 50 mg by mouth at bedtime as needed for allergies. (Patient not taking: Reported on 07/14/2022)     LORazepam (ATIVAN) 0.5 MG tablet Take 1 tablet by mouth twice daily as needed for anxiety 30 tablet 0   losartan (COZAAR) 100 MG tablet TAKE 1 TABLET BY MOUTH AT BEDTIME 90 tablet 3   metoprolol tartrate (LOPRESSOR) 25 MG tablet Take 1 tablet by mouth twice daily 180 tablet 3   No current facility-administered medications for this visit.     Past Medical History:  Diagnosis Date   Allergy    Hypercholesteremia    Hypertension     Past Surgical History:  Procedure Laterality Date   ABDOMINAL HYSTERECTOMY     CHOLECYSTECTOMY     COLONOSCOPY  01/19/2015   LEFT HEART CATH AND CORONARY ANGIOGRAPHY N/A 05/31/2022   Procedure: LEFT HEART CATH AND CORONARY ANGIOGRAPHY;  Surgeon: Belva Crome, MD;  Location: [facility];  Service: Cardiovascular;  Laterality: N/A;   POLYPECTOMY      Social History   Socioeconomic History   Marital status: Married    Spouse name: Not on file   Number of children: Not on file   Years of education: Not on file   Highest education level: Not on file  Occupational History   Not on file  Tobacco Use   Smoking status: Former    Packs/day: 0.50    Years: 44.00    Additional pack years: 0.00    Total pack years: 22.00    Types: Cigarettes    Quit date: 03/27/2012    Years since quitting: 10.7   Smokeless tobacco: Never  Vaping Use   Vaping Use: Never used  Substance and Sexual Activity   Alcohol use: Yes    Comment: ocassionally-rare   Drug use: No   Sexual activity: Never  Other Topics Concern   Not on file  Social History  Narrative   Not on file   Social Determinants of Health   Financial Resource Strain: Not on file  Food Insecurity: Not on file  Transportation Needs: Not on file  Physical Activity: Not on file  Stress: Not on file  Social Connections: Not on file  Intimate Partner Violence: Not on file    Family History  Problem Relation Age of Onset   Colon cancer Sister 73   Lung cancer Sister    Colon cancer Brother 65       METS   Other Maternal Grandfather        ? colon cancer seen on 2005 colon report   Colon cancer Maternal Grandfather    Esophageal cancer Neg Hx    Rectal cancer Neg Hx    Stomach cancer Neg Hx     ROS: no fevers or chills, productive cough, hemoptysis, dysphasia, odynophagia, melena, hematochezia, dysuria, hematuria, rash, seizure activity, orthopnea, PND, pedal edema, claudication. Remaining systems are negative.  Physical Exam: Well-developed well-nourished in no acute distress.  Skin is warm and dry.  HEENT is normal.  Neck is supple.  Chest is clear to auscultation with normal expansion.  Cardiovascular exam is regular rate and rhythm.  Abdominal exam  nontender or distended. No masses palpated. Extremities show no edema. neuro grossly intact  ECG- personally reviewed  A/P  1  Andrea Ruths, MD

## 2023-01-10 ENCOUNTER — Telehealth: Payer: Self-pay | Admitting: Family Medicine

## 2023-01-10 NOTE — Telephone Encounter (Signed)
Called patient to schedule Medicare Annual Wellness Visit (AWV). Left message for patient to call back and schedule Medicare Annual Wellness Visit (AWV).  Last date of AWV: AWVI eligible as of 03/24/2018  Please schedule an AWVI appointment at any time with [place].  If any questions, please contact me at [phone].    Thank you,  [place] Direct dial  [phone]

## 2023-01-12 ENCOUNTER — Ambulatory Visit: Payer: Medicare HMO | Attending: Cardiology | Admitting: Cardiology

## 2023-01-12 ENCOUNTER — Encounter: Payer: Self-pay | Admitting: Cardiology

## 2023-01-12 VITALS — BP 130/80 | HR 66 | Ht 69.0 in | Wt 216.0 lb

## 2023-01-12 DIAGNOSIS — R002 Palpitations: Secondary | ICD-10-CM | POA: Diagnosis not present

## 2023-01-12 DIAGNOSIS — E785 Hyperlipidemia, unspecified: Secondary | ICD-10-CM | POA: Diagnosis not present

## 2023-01-12 DIAGNOSIS — I10 Essential (primary) hypertension: Secondary | ICD-10-CM | POA: Diagnosis not present

## 2023-01-12 MED ORDER — HYDROCHLOROTHIAZIDE 12.5 MG PO CAPS: 12.5000 mg | ORAL_CAPSULE | Freq: Every day | ORAL | 3 refills | Status: DC

## 2023-01-12 NOTE — Patient Instructions (Signed)
Medication Instructions:   START HCTZ 12.5 MG ONCE DAILY  *If you need a refill on your cardiac medications before your next appointment, please call your pharmacy*   Lab Work:  Your physician recommends that you return for lab work in: [facility]  If you have labs (blood work) drawn today and your tests are completely normal, you will receive your results only by: [facility] (if you have MyChart) OR A paper copy in the mail If you have any lab test that is abnormal or we need to change your treatment, we will call you to review the results.    Follow-Up: At [facility], you and your health needs are our priority.  As part of our continuing mission to provide you with exceptional heart care, we have created designated Provider Care Teams.  These Care Teams include your primary Cardiologist (physician) and Advanced Practice Providers (APPs -  Physician Assistants and Nurse Practitioners) who all work together to provide you with the care you need, when you need it.  We recommend signing up for the patient portal called "MyChart".  Sign up information is provided on this After Visit Summary.  MyChart is used to connect with patients for Virtual Visits (Telemedicine).  Patients are able to view lab/test results, encounter notes, upcoming appointments, etc.  Non-urgent messages can be sent to your provider as well.   To learn more about what you can do with MyChart, go to [email or web address].    Your next appointment:   12 month(s)  Provider:   Kirk Ruths, MD

## 2023-01-19 DIAGNOSIS — R002 Palpitations: Secondary | ICD-10-CM | POA: Diagnosis not present

## 2023-01-19 DIAGNOSIS — E785 Hyperlipidemia, unspecified: Secondary | ICD-10-CM | POA: Diagnosis not present

## 2023-01-20 LAB — COMPREHENSIVE METABOLIC PANEL
ALT: 11 IU/L (ref 0–32)
AST: 15 IU/L (ref 0–40)
Albumin/Globulin Ratio: 1.4 (ref 1.2–2.2)
Albumin: 4.2 g/dL (ref 3.9–4.9)
Alkaline Phosphatase: 112 IU/L (ref 44–121)
BUN/Creatinine Ratio: 17 (ref 12–28)
BUN: 21 mg/dL (ref 8–27)
Bilirubin Total: 0.5 mg/dL (ref 0.0–1.2)
CO2: 24 mmol/L (ref 20–29)
Calcium: 9.5 mg/dL (ref 8.7–10.3)
Chloride: 101 mmol/L (ref 96–106)
Creatinine, Ser: 1.22 mg/dL — ABNORMAL HIGH (ref 0.57–1.00)
Globulin, Total: 2.9 g/dL (ref 1.5–4.5)
Glucose: 140 mg/dL — ABNORMAL HIGH (ref 70–99)
Potassium: 4.4 mmol/L (ref 3.5–5.2)
Sodium: 139 mmol/L (ref 134–144)
Total Protein: 7.1 g/dL (ref 6.0–8.5)
eGFR: 48 mL/min/{1.73_m2} — ABNORMAL LOW (ref >59)

## 2023-01-20 LAB — LIPID PANEL
Chol/HDL Ratio: 4 ratio (ref 0.0–4.4)
Cholesterol, Total: 166 mg/dL (ref 100–199)
HDL: 42 mg/dL (ref >39)
LDL Chol Calc (NIH): 86 mg/dL (ref 0–99)
Triglycerides: 226 mg/dL — ABNORMAL HIGH (ref 0–149)
VLDL Cholesterol Cal: 38 mg/dL (ref 5–40)

## 2023-01-20 LAB — CBC
Hematocrit: 45.2 % (ref 34.0–46.6)
Hemoglobin: 15 g/dL (ref 11.1–15.9)
MCH: 29.8 pg (ref 26.6–33.0)
MCHC: 33.2 g/dL (ref 31.5–35.7)
MCV: 90 fL (ref 79–97)
Platelets: 254 10*3/uL (ref 150–450)
RBC: 5.03 x10E6/uL (ref 3.77–5.28)
RDW: 12.4 % (ref 11.7–15.4)
WBC: 6.6 10*3/uL (ref 3.4–10.8)

## 2023-01-20 LAB — TSH: TSH: 2.19 u[IU]/mL (ref 0.450–4.500)

## 2023-01-23 ENCOUNTER — Telehealth: Payer: Self-pay | Admitting: Cardiology

## 2023-01-23 NOTE — Telephone Encounter (Signed)
Patient is calling for her results to her lab work, she states she doesn't have acces to [facility].

## 2023-01-23 NOTE — Telephone Encounter (Signed)
Spoke to patient lab results given.Dr.Crenshaw advised to continue all medications.

## 2023-01-26 DIAGNOSIS — H5203 Hypermetropia, bilateral: Secondary | ICD-10-CM | POA: Diagnosis not present

## 2023-01-30 ENCOUNTER — Encounter: Payer: Self-pay | Admitting: *Deleted

## 2023-02-03 DIAGNOSIS — H524 Presbyopia: Secondary | ICD-10-CM | POA: Diagnosis not present

## 2023-02-13 ENCOUNTER — Other Ambulatory Visit: Payer: Self-pay | Admitting: Family Medicine

## 2023-02-13 DIAGNOSIS — E7800 Pure hypercholesterolemia, unspecified: Secondary | ICD-10-CM

## 2023-04-20 NOTE — Progress Notes (Signed)
    SUBJECTIVE:   CHIEF COMPLAINT / HPI:   Hypertension, SVT, PVD, h/o NSTEMI: - follows with Cardiology - Medications: losartan, hydrochlorothiazide, metoprolol - Compliance: good - Checking BP at home: yes, SBP 130s SBP - Denies any SOB, CP, vision changes, LE edema, medication SEs, or symptoms of hypotension  HLD - medications: lipitor - compliance: good - medication SEs: none  Lung nodule - 30 pack year history, quit 2013 - RLL 5mm subpleural nodules noted on CTA 05/2022, recommended 1 year f/u.  Healthcare Maintenance - due for AWV, shingrix, Td  OBJECTIVE:   BP 127/60   Pulse 73   Ht 5\' 9"  (1.753 m)   Wt 209 lb 6.4 oz (95 kg)   SpO2 98%   BMI 30.92 kg/m   Gen: well appearing, in NAD Card: RRR Lungs: CTAB Ext: WWP, no edema   ASSESSMENT/PLAN:   Hypertension Doing well on current regimen, no changes made today.  NSTEMI (non-ST elevated myocardial infarction) [facility]) Follows with cardiology. Continue to control risk factors, on statin and with good BP control. No longer smoking.   Pulmonary nodule F/u low dose CT ordered today. Congratulated on quitting smoking.  Hyperlipidemia Doing well on statin, continue.   Healthcare maintenance - declines shingrix. Rx given for Td booster. Counseled to schedule AWV.  Caro Laroche, DO

## 2023-04-21 ENCOUNTER — Encounter: Payer: Self-pay | Admitting: Family Medicine

## 2023-04-21 ENCOUNTER — Ambulatory Visit (INDEPENDENT_AMBULATORY_CARE_PROVIDER_SITE_OTHER): Payer: Medicare HMO | Admitting: Family Medicine

## 2023-04-21 VITALS — BP 127/60 | HR 73 | Ht 69.0 in | Wt 209.4 lb

## 2023-04-21 DIAGNOSIS — E785 Hyperlipidemia, unspecified: Secondary | ICD-10-CM | POA: Diagnosis not present

## 2023-04-21 DIAGNOSIS — R911 Solitary pulmonary nodule: Secondary | ICD-10-CM

## 2023-04-21 DIAGNOSIS — Z87891 Personal history of nicotine dependence: Secondary | ICD-10-CM | POA: Diagnosis not present

## 2023-04-21 DIAGNOSIS — Z833 Family history of diabetes mellitus: Secondary | ICD-10-CM | POA: Diagnosis not present

## 2023-04-21 DIAGNOSIS — I10 Essential (primary) hypertension: Secondary | ICD-10-CM

## 2023-04-21 DIAGNOSIS — I214 Non-ST elevation (NSTEMI) myocardial infarction: Secondary | ICD-10-CM

## 2023-04-21 MED ORDER — FLUTICASONE PROPIONATE 50 MCG/ACT NA SUSP: 1.0000 | Freq: Every day | NASAL | 6 refills | Status: DC

## 2023-04-21 MED ORDER — LORAZEPAM 0.5 MG PO TABS: 0.5000 mg | ORAL_TABLET | Freq: Every day | ORAL | 0 refills | Status: DC | PRN

## 2023-04-21 MED ORDER — TETANUS-DIPHTHERIA TOXOIDS TD 5-2 LFU IM INJ: 0.5000 mL | INJECTION | Freq: Once | INTRAMUSCULAR | 0 refills | Status: AC

## 2023-04-21 NOTE — Patient Instructions (Signed)
It was great to see you!  Our plans for today:  - We are getting a follow up lung CT to look at your nodule.  - We are checking some labs today, we will release these results to your MyChart. - We sent flonase to your pharmacy. - Get your tetanus vaccine at the pharmacy.  - We will plan to get your thyroid ultrasound in October.  Take care and seek immediate care sooner if you develop any concerns.   Dr. Linwood Dibbles

## 2023-04-21 NOTE — Assessment & Plan Note (Signed)
Doing well on current regimen, no changes made today. 

## 2023-04-21 NOTE — Assessment & Plan Note (Signed)
Follows with cardiology. Continue to control risk factors, on statin and with good BP control. No longer smoking.

## 2023-04-21 NOTE — Assessment & Plan Note (Signed)
Doing well on statin, continue 

## 2023-04-21 NOTE — Assessment & Plan Note (Signed)
F/u low dose CT ordered today. Congratulated on quitting smoking.

## 2023-04-21 NOTE — Assessment & Plan Note (Signed)
>>  ASSESSMENT AND PLAN FOR HYPERTENSION WRITTEN ON 04/21/2023  8:39 PM BY Lizbett Garciagarcia M, DO  Doing well on current regimen, no changes made today.

## 2023-04-22 LAB — BASIC METABOLIC PANEL
BUN/Creatinine Ratio: 15 (ref 12–28)
BUN: 18 mg/dL (ref 8–27)
CO2: 25 mmol/L (ref 20–29)
Calcium: 9.3 mg/dL (ref 8.7–10.3)
Chloride: 102 mmol/L (ref 96–106)
Creatinine, Ser: 1.19 mg/dL — ABNORMAL HIGH (ref 0.57–1.00)
Glucose: 116 mg/dL — ABNORMAL HIGH (ref 70–99)
Potassium: 4.3 mmol/L (ref 3.5–5.2)
Sodium: 141 mmol/L (ref 134–144)
eGFR: 49 mL/min/{1.73_m2} — ABNORMAL LOW (ref >59)

## 2023-04-22 LAB — HEMOGLOBIN A1C
Est. average glucose Bld gHb Est-mCnc: 143 mg/dL
Hgb A1c MFr Bld: 6.6 % — ABNORMAL HIGH (ref 4.8–5.6)

## 2023-05-02 ENCOUNTER — Ambulatory Visit (HOSPITAL_COMMUNITY)
Admission: RE | Admit: 2023-05-02 | Discharge: 2023-05-02 | Disposition: A | Discharge status: Home / Self Care | Payer: Medicare HMO | Source: Ambulatory Visit | Attending: Family Medicine | Admitting: Family Medicine

## 2023-05-02 DIAGNOSIS — Z87891 Personal history of nicotine dependence: Secondary | ICD-10-CM | POA: Insufficient documentation

## 2023-05-02 DIAGNOSIS — R911 Solitary pulmonary nodule: Secondary | ICD-10-CM | POA: Insufficient documentation

## 2023-05-05 ENCOUNTER — Ambulatory Visit (INDEPENDENT_AMBULATORY_CARE_PROVIDER_SITE_OTHER): Payer: Medicare HMO

## 2023-05-05 ENCOUNTER — Other Ambulatory Visit: Payer: Self-pay | Admitting: Family Medicine

## 2023-05-05 VITALS — Ht 69.0 in | Wt 209.0 lb

## 2023-05-05 DIAGNOSIS — Z1231 Encounter for screening mammogram for malignant neoplasm of breast: Secondary | ICD-10-CM

## 2023-05-05 DIAGNOSIS — Z0000 Encounter for general adult medical examination without abnormal findings: Secondary | ICD-10-CM | POA: Diagnosis not present

## 2023-05-05 NOTE — Patient Instructions (Addendum)
[name] , Thank you for taking time to come for your Medicare Wellness Visit. I appreciate your ongoing commitment to your health goals. Please review the following plan we discussed and let me know if I can assist you in the future.   These are the goals we discussed:  Goals      Remain active and independent        This is a list of the screening recommended for you and due dates:  Health Maintenance  Topic Date Due   COVID-19 Vaccine (7 - 2023-24 season) 10/21/2022   DTaP/Tdap/Td vaccine (3 - Td or Tdap) 01/26/2023   Screening for Lung Cancer  06/01/2023   Zoster (Shingles) Vaccine (1 of 2) 07/22/2023*   Flu Shot  05/25/2023   Medicare Annual Wellness Visit  05/04/2024   Mammogram  05/26/2024   Colon Cancer Screening  02/05/2025   Pneumonia Vaccine  Completed   DEXA scan (bone density measurement)  Completed   Hepatitis C Screening  Completed   HPV Vaccine  Aged Out  *Topic was postponed. The date shown is not the original due date.    Advanced directives: Information on Advanced Care Planning can be found at [facility] of [facility] Advance Health Care Directives Advance Health Care Directives ([email or web address] Please bring a copy of your health care power of attorney and living will to the office to be added to your chart at your convenience.  Conditions/risks identified: Aim for 30 minutes of exercise or brisk walking, 6-8 glasses of water, and 5 servings of fruits and vegetables each day.  Next appointment: Follow up in one year for your annual wellness visit    Preventive Care 65 Years and Older, Female Preventive care refers to lifestyle choices and visits with your health care provider that can promote health and wellness. What does preventive care include? A yearly physical exam. This is also called an annual well check. Dental exams once or twice a year. Routine eye exams. Ask your health care provider how often you should have your eyes checked. Personal  lifestyle choices, including: Daily care of your teeth and gums. Regular physical activity. Eating a healthy diet. Avoiding tobacco and drug use. Limiting alcohol use. Practicing safe sex. Taking low-dose aspirin every day. Taking vitamin and mineral supplements as recommended by your health care provider. What happens during an annual well check? The services and screenings done by your health care provider during your annual well check will depend on your age, overall health, lifestyle risk factors, and family history of disease. Counseling  Your health care provider may ask you questions about your: Alcohol use. Tobacco use. Drug use. Emotional well-being. Home and relationship well-being. Sexual activity. Eating habits. History of falls. Memory and ability to understand (cognition). Work and work Astronomer. Reproductive health. Screening  You may have the following tests or measurements: Height, weight, and BMI. Blood pressure. Lipid and cholesterol levels. These may be checked every 5 years, or more frequently if you are over 34 years old. Skin check. Lung cancer screening. You may have this screening every year starting at age 39 if you have a 30-pack-year history of smoking and currently smoke or have quit within the past 15 years. Fecal occult blood test (FOBT) of the stool. You may have this test every year starting at age 38. Flexible sigmoidoscopy or colonoscopy. You may have a sigmoidoscopy every 5 years or a colonoscopy every 10 years starting at age 31. Hepatitis C blood test. Hepatitis B  blood test. Sexually transmitted disease (STD) testing. Diabetes screening. This is done by checking your blood sugar (glucose) after you have not eaten for a while (fasting). You may have this done every 1-3 years. Bone density scan. This is done to screen for osteoporosis. You may have this done starting at age 55. Mammogram. This may be done every 1-2 years. Talk to your  health care provider about how often you should have regular mammograms. Talk with your health care provider about your test results, treatment options, and if necessary, the need for more tests. Vaccines  Your health care provider may recommend certain vaccines, such as: Influenza vaccine. This is recommended every year. Tetanus, diphtheria, and acellular pertussis (Tdap, Td) vaccine. You may need a Td booster every 10 years. Zoster vaccine. You may need this after age 34. Pneumococcal 13-valent conjugate (PCV13) vaccine. One dose is recommended after age 12. Pneumococcal polysaccharide (PPSV23) vaccine. One dose is recommended after age 54. Talk to your health care provider about which screenings and vaccines you need and how often you need them. This information is not intended to replace advice given to you by your health care provider. Make sure you discuss any questions you have with your health care provider. Document Released: 11/06/2015 Document Revised: 06/29/2016 Document Reviewed: 08/11/2015 Elsevier Interactive Patient Education  2017 [employer].  Fall Prevention in the Home Falls can cause injuries. They can happen to people of all ages. There are many things you can do to make your home safe and to help prevent falls. What can I do on the outside of my home? Regularly fix the edges of walkways and driveways and fix any cracks. Remove anything that might make you trip as you walk through a door, such as a raised step or threshold. Trim any bushes or trees on the path to your home. Use bright outdoor lighting. Clear any walking paths of anything that might make someone trip, such as rocks or tools. Regularly check to see if handrails are loose or broken. Make sure that both sides of any steps have handrails. Any raised decks and porches should have guardrails on the edges. Have any leaves, snow, or ice cleared regularly. Use sand or salt on walking paths during winter. Clean  up any spills in your garage right away. This includes oil or grease spills. What can I do in the bathroom? Use night lights. Install grab bars by the toilet and in the tub and shower. Do not use towel bars as grab bars. Use non-skid mats or decals in the tub or shower. If you need to sit down in the shower, use a plastic, non-slip stool. Keep the floor dry. Clean up any water that spills on the floor as soon as it happens. Remove soap buildup in the tub or shower regularly. Attach bath mats securely with double-sided non-slip rug tape. Do not have throw rugs and other things on the floor that can make you trip. What can I do in the bedroom? Use night lights. Make sure that you have a light by your bed that is easy to reach. Do not use any sheets or blankets that are too big for your bed. They should not hang down onto the floor. Have a firm chair that has side arms. You can use this for support while you get dressed. Do not have throw rugs and other things on the floor that can make you trip. What can I do in the kitchen? Clean up any spills  right away. Avoid walking on wet floors. Keep items that you use a lot in easy-to-reach places. If you need to reach something above you, use a strong step stool that has a grab bar. Keep electrical cords out of the way. Do not use floor polish or wax that makes floors slippery. If you must use wax, use non-skid floor wax. Do not have throw rugs and other things on the floor that can make you trip. What can I do with my stairs? Do not leave any items on the stairs. Make sure that there are handrails on both sides of the stairs and use them. Fix handrails that are broken or loose. Make sure that handrails are as long as the stairways. Check any carpeting to make sure that it is firmly attached to the stairs. Fix any carpet that is loose or worn. Avoid having throw rugs at the top or bottom of the stairs. If you do have throw rugs, attach them to the  floor with carpet tape. Make sure that you have a light switch at the top of the stairs and the bottom of the stairs. If you do not have them, ask someone to add them for you. What else can I do to help prevent falls? Wear shoes that: Do not have high heels. Have rubber bottoms. Are comfortable and fit you well. Are closed at the toe. Do not wear sandals. If you use a stepladder: Make sure that it is fully opened. Do not climb a closed stepladder. Make sure that both sides of the stepladder are locked into place. Ask someone to hold it for you, if possible. Clearly mark and make sure that you can see: Any grab bars or handrails. First and last steps. Where the edge of each step is. Use tools that help you move around (mobility aids) if they are needed. These include: Canes. Walkers. Scooters. Crutches. Turn on the lights when you go into a dark area. Replace any light bulbs as soon as they burn out. Set up your furniture so you have a clear path. Avoid moving your furniture around. If any of your floors are uneven, fix them. If there are any pets around you, be aware of where they are. Review your medicines with your doctor. Some medicines can make you feel dizzy. This can increase your chance of falling. Ask your doctor what other things that you can do to help prevent falls. This information is not intended to replace advice given to you by your health care provider. Make sure you discuss any questions you have with your health care provider. Document Released: 08/06/2009 Document Revised: 03/17/2016 Document Reviewed: 11/14/2014 Elsevier Interactive Patient Education  2017 [employer].

## 2023-05-05 NOTE — Progress Notes (Signed)
Subjective:   Andrea Robles is a 72 y.o. female who presents for an Initial Medicare Annual Wellness Visit.  Visit Complete: Virtual  I connected with  [name] on 05/05/23 by a audio enabled telemedicine application and verified that I am speaking with the correct person using two identifiers.  Patient Location: Home  Provider Location: Home Office  I discussed the limitations of evaluation and management by telemedicine. The patient expressed understanding and agreed to proceed.  Review of Systems     Cardiac Risk Factors include: advanced age (>39men, >37 women);hypertension;dyslipidemia;sedentary lifestyle  Per patient no change in vitals since last visit, unable to obtain new vitals due to telehealth visit     Objective:    Today's Vitals   05/05/23 1814  Weight: 209 lb (94.8 kg)  Height: 5\' 9"  (1.753 m)   Body mass index is 30.86 kg/m.     05/05/2023    6:18 PM 04/21/2023   10:43 AM 10/13/2022    1:42 PM 07/21/2022   12:04 PM 06/20/2022   11:01 AM 05/30/2022   11:22 PM 05/09/2022    9:53 AM  Advanced Directives  Does Patient Have a Medical Advance Directive? No No No No No No No  Would patient like information on creating a medical advance directive? Yes (MAU/Ambulatory/Procedural Areas - Information given)  No - Patient declined No - Patient declined   No - Patient declined    Current Medications (verified) Outpatient Encounter Medications as of 05/05/2023  Medication Sig   atorvastatin (LIPITOR) 20 MG tablet Take 1 tablet by mouth once daily   cetirizine (ZYRTEC) 10 MG tablet Take 10 mg by mouth daily.   diphenhydrAMINE (BENADRYL) 50 MG tablet Take 50 mg by mouth at bedtime as needed for allergies.   fluticasone (FLONASE) 50 MCG/ACT nasal spray Place 1 spray into both nostrils daily.   hydrochlorothiazide (MICROZIDE) 12.5 MG capsule Take 1 capsule (12.5 mg total) by mouth daily.   LORazepam (ATIVAN) 0.5 MG tablet Take 1 tablet (0.5 mg total) by mouth  daily as needed. for anxiety   losartan (COZAAR) 100 MG tablet TAKE 1 TABLET BY MOUTH AT BEDTIME   metoprolol tartrate (LOPRESSOR) 25 MG tablet Take 1 tablet by mouth twice daily   No facility-administered encounter medications on file as of 05/05/2023.    Allergies (verified) Statins, Colcrys [colchicine], Hydrocodone, Flagyl [metronidazole], Flexeril [cyclobenzaprine], Pamelor [nortriptyline], and Pepcid [famotidine]   History: Past Medical History:  Diagnosis Date   Allergy    Hypercholesteremia    Hypertension    Past Surgical History:  Procedure Laterality Date   ABDOMINAL HYSTERECTOMY     CHOLECYSTECTOMY     COLONOSCOPY  01/19/2015   LEFT HEART CATH AND CORONARY ANGIOGRAPHY N/A 05/31/2022   Procedure: LEFT HEART CATH AND CORONARY ANGIOGRAPHY;  Surgeon: Lyn Records, MD;  Location: [facility];  Service: Cardiovascular;  Laterality: N/A;   POLYPECTOMY     Family History  Problem Relation Age of Onset   Colon cancer Sister 29   Lung cancer Sister    Colon cancer Brother 64       METS   Other Maternal Grandfather        ? colon cancer seen on 2005 colon report   Colon cancer Maternal Grandfather    Esophageal cancer Neg Hx    Rectal cancer Neg Hx    Stomach cancer Neg Hx    Social History   Socioeconomic History   Marital status: Married  Spouse name: Not on file   Number of children: Not on file   Years of education: Not on file   Highest education level: Not on file  Occupational History   Not on file  Tobacco Use   Smoking status: Former    Current packs/day: 0.00    Average packs/day: 0.5 packs/day for 44.0 years (22.0 ttl pk-yrs)    Types: Cigarettes    Start date: 03/27/1968    Quit date: 03/27/2012    Years since quitting: 11.1   Smokeless tobacco: Never  Vaping Use   Vaping status: Never Used  Substance and Sexual Activity   Alcohol use: Yes    Comment: ocassionally-rare   Drug use: No   Sexual activity: Never  Other Topics Concern    Not on file  Social History Narrative   Not on file   Social Determinants of Health   Financial Resource Strain: Low Risk  (05/05/2023)   Overall Financial Resource Strain (CARDIA)    Difficulty of Paying Living Expenses: Not hard at all  Food Insecurity: No Food Insecurity (05/05/2023)   Hunger Vital Sign    Worried About Running Out of Food in the Last Year: Never true    Ran Out of Food in the Last Year: Never true  Transportation Needs: No Transportation Needs (05/05/2023)   PRAPARE - Administrator, Civil Service (Medical): No    Lack of Transportation (Non-Medical): No  Physical Activity: Insufficiently Active (05/05/2023)   Exercise Vital Sign    Days of Exercise per Week: 3 days    Minutes of Exercise per Session: 30 min  Stress: No Stress Concern Present (05/05/2023)   [employer] of Occupational Health - Occupational Stress Questionnaire    Feeling of Stress : Not at all  Social Connections: Moderately Integrated (05/05/2023)   Social Connection and Isolation Panel [NHANES]    Frequency of Communication with Friends and Family: More than three times a week    Frequency of Social Gatherings with Friends and Family: Three times a week    Attends Religious Services: More than 4 times per year    Active Member of Clubs or Organizations: No    Attends Banker Meetings: Never    Marital Status: Married    Tobacco Counseling Counseling given: Not Answered   Clinical Intake:  Pre-visit preparation completed: Yes  Pain : No/denies pain     Diabetes: No  How often do you need to have someone help you when you read instructions, pamphlets, or other written materials from your doctor or pharmacy?: 1 - Never  Interpreter Needed?: No  Information entered by :: Kandis Fantasia LPN   Activities of Daily Living    05/05/2023    6:18 PM  In your present state of health, do you have any difficulty performing the following activities:  Hearing?  0  Vision? 0  Difficulty concentrating or making decisions? 0  Walking or climbing stairs? 0  Dressing or bathing? 0  Doing errands, shopping? 0  Preparing Food and eating ? N  Using the Toilet? N  In the past six months, have you accidently leaked urine? N  Do you have problems with loss of bowel control? N  Managing your Medications? N  Managing your Finances? N  Housekeeping or managing your Housekeeping? N    Patient Care Team: Caro Laroche, DO as PCP - General (Family Medicine) Jens Som Madolyn Frieze, MD as PCP - Cardiology (Cardiology) Conley Rolls, [place], [place]  as Referring Physician (Optometry)  Indicate any recent Medical Services you may have received from other than [facility] providers in the past year (date may be approximate).     Assessment:   This is a routine wellness examination for Ulanda.  Hearing/Vision screen Hearing Screening - Comments:: Denies hearing difficulties   Vision Screening - Comments:: Wears rx glasses - up to date with routine eye exams with [facility])    Dietary issues and exercise activities discussed:     Goals Addressed             This Visit's Progress    Remain active and independent        Depression Screen    05/05/2023    6:17 PM 04/21/2023   10:42 AM 10/13/2022    1:41 PM 07/21/2022   12:19 PM 06/20/2022   11:00 AM 05/09/2022    9:51 AM 03/17/2021    4:08 PM  PHQ 2/9 Scores  PHQ - 2 Score 0 0 0 0 0 0 0  PHQ- 9 Score 0 0 0 0 0 0 0    Fall Risk    05/05/2023    6:18 PM 04/21/2023   10:42 AM 10/13/2022    1:41 PM 07/21/2022   12:23 PM 06/20/2022   11:01 AM  Fall Risk   Falls in the past year? 0 0 0 0 0  Number falls in past yr: 0 0 0  0  Injury with Fall? 0 0 0    Risk for fall due to : No Fall Risks      Follow up Falls prevention discussed;Education provided;Falls evaluation completed    Falls evaluation completed    MEDICARE RISK AT HOME:  Medicare Risk at Home - 05/05/23 1818     Any stairs in or around  the home? Yes    If so, are there any without handrails? No    Home free of loose throw rugs in walkways, pet beds, electrical cords, etc? Yes    Adequate lighting in your home to reduce risk of falls? Yes    Life alert? No    Use of a cane, walker or w/c? No    Grab bars in the bathroom? Yes    Shower chair or bench in shower? No    Elevated toilet seat or a handicapped toilet? No             TIMED UP AND GO:  Was the test performed? No    Cognitive Function:        05/05/2023    6:18 PM  6CIT Screen  What Year? 0 points  What month? 0 points  What time? 0 points  Count back from 20 0 points  Months in reverse 0 points  Repeat phrase 0 points  Total Score 0 points    Immunizations Immunization History  Administered Date(s) Administered   COVID-19, mRNA, vaccine(Comirnaty)12 years and older 08/26/2022   Fluad Quad(high Dose 65+) 07/29/2020, 07/15/2021, 07/21/2022   Influenza Split 08/03/2012   Influenza Whole 07/27/2007, 08/01/2008   Influenza,inj,Quad PF,6+ Mos 07/08/2013, 07/09/2014, 07/13/2016, 07/26/2017, 07/17/2019   Influenza-Unspecified 07/28/2015, 07/18/2018   PFIZER Comirnaty(Gray Top)Covid-19 Tri-Sucrose Vaccine 03/17/2021   PFIZER(Purple Top)SARS-COV-2 Vaccination 11/28/2019, 12/19/2019, 07/29/2020   Pfizer Covid-19 Vaccine Bivalent Booster 11yrs & up 10/11/2021   Pneumococcal Conjugate-13 04/27/2017   Pneumococcal Polysaccharide-23 09/23/2000, 06/27/2008, 10/03/2018   Td 10/24/2002   Tdap 01/25/2013   Zoster, Live 10/27/2014    TDAP status: Up to date  Pneumococcal vaccine status: Up to date  Covid-19 vaccine status: Information provided on how to obtain vaccines.   Qualifies for Shingles Vaccine? Yes   Zostavax completed Yes   Shingrix Completed?: No.    Education has been provided regarding the importance of this vaccine. Patient has been advised to call insurance company to determine out of pocket expense if they have not yet received this  vaccine. Advised may also receive vaccine at local pharmacy or Health Dept. Verbalized acceptance and understanding.  Screening Tests Health Maintenance  Topic Date Due   COVID-19 Vaccine (7 - 2023-24 season) 10/21/2022   DTaP/Tdap/Td (3 - Td or Tdap) 01/26/2023   Lung Cancer Screening  06/01/2023   Zoster Vaccines- Shingrix (1 of 2) 07/22/2023 (Originally 03/27/1970)   INFLUENZA VACCINE  05/25/2023   Medicare Annual Wellness (AWV)  05/04/2024   MAMMOGRAM  05/26/2024   Colonoscopy  02/05/2025   Pneumonia Vaccine 61+ Years old  Completed   DEXA SCAN  Completed   Hepatitis C Screening  Completed   HPV VACCINES  Aged Out    Health Maintenance  Health Maintenance Due  Topic Date Due   COVID-19 Vaccine (7 - 2023-24 season) 10/21/2022   DTaP/Tdap/Td (3 - Td or Tdap) 01/26/2023   Lung Cancer Screening  06/01/2023    Colorectal cancer screening: Type of screening: Colonoscopy. Completed 02/06/20. Repeat every 5 years  Mammogram status: Completed 05/26/22. Repeat every year  Bone Density status: Completed 04/17/12. Results reflect: Bone density results: NORMAL. Repeat every 5 years.  Lung Cancer Screening: (Low Dose CT Chest recommended if Age 35-80 years, 20 pack-year currently smoking OR have quit w/in 15years.) does qualify.   Lung Cancer Screening Referral: last 05/31/22  Additional Screening:  Hepatitis C Screening: does qualify; Completed 07/13/16  Vision Screening: Recommended annual ophthalmology exams for early detection of glaucoma and other disorders of the eye. Is the patient up to date with their annual eye exam?  Yes  Who is the provider or what is the name of the office in which the patient attends annual eye exams? [facility] If pt is not established with a provider, would they like to be referred to a provider to establish care? No .   Dental Screening: Recommended annual dental exams for proper oral hygiene  Community Resource Referral / Chronic Care  Management: CRR required this visit?  No   CCM required this visit?  No     Plan:     I have personally reviewed and noted the following in the patient's chart:   Medical and social history Use of alcohol, tobacco or illicit drugs  Current medications and supplements including opioid prescriptions. Patient is not currently taking opioid prescriptions. Functional ability and status Nutritional status Physical activity Advanced directives List of other physicians Hospitalizations, surgeries, and ER visits in previous 12 months Vitals Screenings to include cognitive, depression, and falls Referrals and appointments  In addition, I have reviewed and discussed with patient certain preventive protocols, quality metrics, and best practice recommendations. A written personalized care plan for preventive services as well as general preventive health recommendations were provided to patient.     Kandis Fantasia [place], [place]   1/61/0960   After Visit Summary: (MyChart) Due to this being a telephonic visit, the after visit summary with patients personalized plan was offered to patient via MyChart   Nurse Notes: No concerns

## 2023-05-08 ENCOUNTER — Other Ambulatory Visit: Payer: Self-pay | Admitting: Family Medicine

## 2023-05-08 DIAGNOSIS — E7800 Pure hypercholesterolemia, unspecified: Secondary | ICD-10-CM

## 2023-05-29 ENCOUNTER — Ambulatory Visit
Admission: RE | Admit: 2023-05-29 | Discharge: 2023-05-29 | Disposition: A | Discharge status: Home / Self Care | Payer: Medicare HMO | Source: Ambulatory Visit | Attending: Family Medicine | Admitting: Family Medicine

## 2023-05-29 DIAGNOSIS — Z1231 Encounter for screening mammogram for malignant neoplasm of breast: Secondary | ICD-10-CM | POA: Diagnosis not present

## 2023-06-05 ENCOUNTER — Other Ambulatory Visit: Payer: Self-pay

## 2023-06-05 DIAGNOSIS — I10 Essential (primary) hypertension: Secondary | ICD-10-CM

## 2023-06-06 MED ORDER — LOSARTAN POTASSIUM 100 MG PO TABS: 100.0000 mg | ORAL_TABLET | Freq: Every day | ORAL | 3 refills | Status: DC

## 2023-07-05 ENCOUNTER — Telehealth: Payer: Self-pay | Admitting: *Deleted

## 2023-07-05 NOTE — Progress Notes (Signed)
  Care Coordination   Note   07/05/2023 Name: Andrea Robles MRN: [id number] DOB: 1951/08/12  Andrea Robles is a 72 y.o. year old female who sees Caro Laroche, DO for primary care. I reached out to [name] by phone today to offer care coordination services.  [name] was given information about Care Coordination services today including:   The Care Coordination services include support from the care team which includes your Nurse Coordinator, Clinical Social Worker, or Pharmacist.  The Care Coordination team is here to help remove barriers to the health concerns and goals most important to you. Care Coordination services are voluntary, and the patient may decline or stop services at any time by request to their care team member.   Care Coordination Consent Status: Patient agreed to services and verbal consent obtained.   Follow up plan:  Telephone appointment with care coordination team member scheduled for:  08/02/23  Encounter Outcome:  Patient Scheduled  [facility] Coordination Care Guide  Direct Dial: [phone]

## 2023-08-02 ENCOUNTER — Ambulatory Visit: Payer: Self-pay

## 2023-08-02 NOTE — Patient Instructions (Signed)
Visit Information  Thank you for taking time to visit with me today. Please don't hesitate to contact me if I can be of assistance to you.   Following are the goals we discussed today:   Goals Addressed             This Visit's Progress    COMPLETED: Care Coordination Activites -follow up activites        Care Coordination Interventions: Active listening / Reflection utilized  Emotional Support Provided Problem Solving /Task Center strategies reviewed Discussed/.Educated Care Coordination Program 2.   Discussed/.Educated Annual Wellness Visit 3.   Discussed/.Educated Social Determinates of Health 4.   Please inform PCP if services needed in the future          If you are experiencing a Mental Health or Behavioral Health Crisis or need someone to talk to, please call 1-800-273-TALK (toll free, 24 hour hotline)  Patient verbalizes understanding of instructions and care plan provided today and agrees to view in MyChart. Active MyChart status and patient understanding of how to access instructions and care plan via MyChart confirmed with patient.     No further follow up required  [name] RN, BSN, [facility]   Care Coordinator Phone: [phone]

## 2023-08-02 NOTE — Patient Outreach (Signed)
Care Coordination   Initial Visit Note   08/02/2023 Name: Andrea Robles MRN: [id number] DOB: 1951-02-20  Andrea Robles is a 72 y.o. year old female who sees Andrea Laroche, DO for primary care. I spoke with  Andrea Robles by phone today.  What matters to the patients health and wellness today?  I had a great conversation with Andrea Robles and Andrea Robles. They both feel that they are receiving good care from their physicians and don't have any major concerns at the moment, except that they would like to schedule appointments to get their flu and COVID shots. Additionally, Andrea Robles would like to have her thyroid checked. I suggested that they call the office to schedule these appointments.      Goals Addressed             This Visit's Progress    COMPLETED: Care Coordination Activites -follow up activites        Care Coordination Interventions: Active listening / Reflection utilized  Emotional Support Provided Problem Solving /Task Center strategies reviewed Discussed/.Educated Care Coordination Program 2.   Discussed/.Educated Annual Wellness Visit 3.   Discussed/.Educated Social Determinates of Health 4.   Please inform PCP if services needed in the future         SDOH assessments and interventions completed:  Yes  SDOH Interventions Today    Flowsheet Row Most Recent Value  SDOH Interventions   Food Insecurity Interventions Intervention Not Indicated  Transportation Interventions Intervention Not Indicated        Care Coordination Interventions:  Yes, provided   Interventions Today    Flowsheet Row Most Recent Value  Chronic Disease   Chronic disease during today's visit Hypertension (HTN)  General Interventions   General Interventions Discussed/Reviewed General Interventions Discussed  [Disscussed Care Coordination Program]  Education Interventions   Education Provided Provided Education  Pharmacy Interventions   Pharmacy  Dicussed/Reviewed Pharmacy Topics Discussed  Safety Interventions   Safety Discussed/Reviewed Safety Discussed        Follow up plan: No further intervention required.   Encounter Outcome:  Patient Visit Completed   [name] RN, BSN, [facility] [employer] Phone: [phone]

## 2023-08-14 ENCOUNTER — Encounter: Payer: Self-pay | Admitting: Family Medicine

## 2023-08-14 ENCOUNTER — Ambulatory Visit: Payer: Medicare HMO | Admitting: Family Medicine

## 2023-08-14 VITALS — BP 134/69 | HR 55 | Ht 68.0 in | Wt 209.2 lb

## 2023-08-14 DIAGNOSIS — Z23 Encounter for immunization: Secondary | ICD-10-CM | POA: Diagnosis not present

## 2023-08-14 DIAGNOSIS — R7303 Prediabetes: Secondary | ICD-10-CM | POA: Diagnosis not present

## 2023-08-14 DIAGNOSIS — I10 Essential (primary) hypertension: Secondary | ICD-10-CM

## 2023-08-14 DIAGNOSIS — E041 Nontoxic single thyroid nodule: Secondary | ICD-10-CM

## 2023-08-14 DIAGNOSIS — I739 Peripheral vascular disease, unspecified: Secondary | ICD-10-CM | POA: Diagnosis not present

## 2023-08-14 DIAGNOSIS — E785 Hyperlipidemia, unspecified: Secondary | ICD-10-CM | POA: Diagnosis not present

## 2023-08-14 DIAGNOSIS — R911 Solitary pulmonary nodule: Secondary | ICD-10-CM | POA: Diagnosis not present

## 2023-08-14 DIAGNOSIS — I471 Supraventricular tachycardia, unspecified: Secondary | ICD-10-CM

## 2023-08-14 DIAGNOSIS — J449 Chronic obstructive pulmonary disease, unspecified: Secondary | ICD-10-CM

## 2023-08-14 LAB — POCT GLYCOSYLATED HEMOGLOBIN (HGB A1C): HbA1c, POC (prediabetic range): 6.3 % (ref 5.7–6.4)

## 2023-08-14 NOTE — Progress Notes (Unsigned)
    SUBJECTIVE:   CHIEF COMPLAINT / HPI:   Hyperglycemia - Last A1c 6.6 04/21/23 (new) - Medications: diet controlled - Eye exam: yes, within the last year. [facility] - Foot exam: *** - Microalbumin: *** - Statin: yes - Denies symptoms of hypoglycemia, polyuria, polydipsia, numbness extremities, foot ulcers/trauma  Hypertension, SVT, PVD, h/o NSTEMI: - follows with Cardiology - Medications: losartan, hydrochlorothiazide, metoprolol - Compliance: *** - Checking BP at home: *** - Denies any SOB, CP, vision changes, LE edema, medication SEs, or symptoms of hypotension - former smoker  Thyroid nodule - incidentally seen on previous CT, f/u US with category 3 nodule 07/2022, recommending f/u in 1 year. TSH 12/2022 wnl.     OBJECTIVE:   BP 134/69   Pulse (!) 55   Ht 5\' 8"  (1.727 m)   Wt 209 lb 3.2 oz (94.9 kg)   SpO2 100%   BMI 31.81 kg/m   ***  ASSESSMENT/PLAN:   No problem-specific Assessment & Plan notes found for this encounter.     Andrea Laroche, DO

## 2023-08-14 NOTE — Patient Instructions (Signed)
It was great to see you!  Our plans for today:  - We are getting an ultrasound of your thyroid. We will call you with these results.   We are checking some labs today, we will release these results to your MyChart.  Take care and seek immediate care sooner if you develop any concerns.   Dr. Linwood Dibbles

## 2023-08-15 NOTE — Assessment & Plan Note (Signed)
Tolerant of statin, continue. 

## 2023-08-15 NOTE — Assessment & Plan Note (Addendum)
Emphysematous changes noted incidentally on imaging. Currently asymptomatic. Continue to monitor. If becomes symptomatic, consider PFTs.

## 2023-08-15 NOTE — Assessment & Plan Note (Signed)
F/u chest CT benign/stable. Plan for annual screening 04/2024.

## 2023-08-15 NOTE — Assessment & Plan Note (Signed)
F/u thyroid US ordered today.

## 2023-08-15 NOTE — Assessment & Plan Note (Addendum)
At goal for age. Continue to follow with cardiology. No changes.

## 2023-08-15 NOTE — Assessment & Plan Note (Signed)
Recheck A1c 

## 2023-08-18 ENCOUNTER — Ambulatory Visit (HOSPITAL_COMMUNITY)
Admission: RE | Admit: 2023-08-18 | Discharge: 2023-08-18 | Disposition: A | Discharge status: Home / Self Care | Payer: Medicare HMO | Source: Ambulatory Visit | Attending: Family Medicine | Admitting: Family Medicine

## 2023-08-18 DIAGNOSIS — E042 Nontoxic multinodular goiter: Secondary | ICD-10-CM | POA: Diagnosis not present

## 2023-08-18 DIAGNOSIS — E041 Nontoxic single thyroid nodule: Secondary | ICD-10-CM | POA: Diagnosis not present

## 2023-08-28 ENCOUNTER — Telehealth: Payer: Self-pay

## 2023-08-28 NOTE — Telephone Encounter (Signed)
Patient calls nurse line requesting imaging results.   Advised we do not have them back yet. Advised it is taking longer than usual for Radiology.   Advised we will call her with results once they have been read by Radiology.  Will forward to PCP.

## 2023-09-02 NOTE — Addendum Note (Signed)
Addended by: Caro Laroche on: 09/02/2023 02:08 PM   Modules accepted: Orders

## 2023-09-04 ENCOUNTER — Other Ambulatory Visit: Payer: Self-pay | Admitting: Family Medicine

## 2023-09-04 ENCOUNTER — Telehealth: Payer: Self-pay

## 2023-09-04 NOTE — Telephone Encounter (Signed)
Called patient to inform her of Thyroid ultrasound FNA biopsy.  [facility]  09/07/2023 1415 with arrival of [address] [place], [place] [address] [phone]  No show fee is $75 if not cancelled within 24 hours of appointment.  Patient was given number to office as well.  Glennie Hawk, CMA

## 2023-09-07 ENCOUNTER — Inpatient Hospital Stay: Admission: RE | Admit: 2023-09-07 | Payer: Medicare HMO | Source: Ambulatory Visit

## 2023-09-08 ENCOUNTER — Telehealth: Payer: Self-pay

## 2023-09-08 DIAGNOSIS — E041 Nontoxic single thyroid nodule: Secondary | ICD-10-CM

## 2023-09-08 NOTE — Telephone Encounter (Signed)
New order placed with different location. [facility] team please schedule.   Terisa Starr, MD  Family Medicine Teaching Service

## 2023-09-08 NOTE — Telephone Encounter (Signed)
Rescheduled patient at [facility] for Thyroid Ultrasound Biopsy.  Patient does have MYCHART and says that she has been informed.  09/28/2023 [place] entrance A 1300 with arrival at 1230  Patient was advised to call [facility] to cancel prior to appointment that she made there unless she wants t pay a 75 dollar no show fee.  Patient verbalizes understanding.  Glennie Hawk, CMA
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
## 2023-09-27 ENCOUNTER — Ambulatory Visit (HOSPITAL_COMMUNITY): Payer: Medicare HMO

## 2023-09-28 ENCOUNTER — Ambulatory Visit (HOSPITAL_COMMUNITY)
Admission: RE | Admit: 2023-09-28 | Discharge: 2023-09-28 | Disposition: A | Discharge status: Home / Self Care | Payer: Medicare HMO | Source: Ambulatory Visit | Attending: Family Medicine | Admitting: Family Medicine

## 2023-09-28 DIAGNOSIS — E041 Nontoxic single thyroid nodule: Secondary | ICD-10-CM | POA: Insufficient documentation

## 2023-09-28 MED ORDER — LIDOCAINE HCL (PF) 1 % IJ SOLN
10.0000 mL | Freq: Once | INTRAMUSCULAR | Status: AC
  Administered 2023-09-28: 10 mL via INTRADERMAL

## 2023-09-29 LAB — CYTOLOGY - NON PAP

## 2023-10-05 ENCOUNTER — Telehealth: Payer: Self-pay

## 2023-10-05 NOTE — Telephone Encounter (Signed)
Patient LVM on nurse line requesting recent Thyroid results.   Will forward to PCP.

## 2023-10-23 ENCOUNTER — Other Ambulatory Visit: Payer: Medicare HMO

## 2023-11-08 ENCOUNTER — Ambulatory Visit: Payer: No Typology Code available for payment source | Admitting: Family Medicine

## 2023-11-08 ENCOUNTER — Encounter: Payer: Self-pay | Admitting: Family Medicine

## 2023-11-08 VITALS — BP 127/62 | HR 61 | Ht 69.0 in | Wt 209.0 lb

## 2023-11-08 DIAGNOSIS — R7303 Prediabetes: Secondary | ICD-10-CM

## 2023-11-08 DIAGNOSIS — R5383 Other fatigue: Secondary | ICD-10-CM | POA: Diagnosis not present

## 2023-11-08 DIAGNOSIS — N1831 Chronic kidney disease, stage 3a: Secondary | ICD-10-CM

## 2023-11-08 LAB — POCT GLYCOSYLATED HEMOGLOBIN (HGB A1C): HbA1c, POC (controlled diabetic range): 6.7 % (ref 0.0–7.0)

## 2023-11-08 MED ORDER — AMOXICILLIN-POT CLAVULANATE 875-125 MG PO TABS
1.0000 | ORAL_TABLET | Freq: Two times a day (BID) | ORAL | 0 refills | Status: AC
Start: 2023-11-08 — End: 2023-11-13

## 2023-11-08 NOTE — Progress Notes (Signed)
   SUBJECTIVE:   CHIEF COMPLAINT / HPI:   FATIGUE Duration:   about a year Symptoms improve with rest: yes  Depressive symptoms: no Stress/anxiety: no Insomnia: no  Snoring: no Observed apnea by bed partner: no Daytime hypersomnolence:yes Wakes feeling refreshed: yes History of sleep study: no Dysnea on exertion:  yes Orthopnea/PND: no Chest pain: no Chronic cough: no Lower extremity edema: no Reports regular bedtime at 10pm. No trouble going to or staying asleep. Wakes around 8am.   Sinus trouble - Nonproductive cough, runny nose x few weeks. Subjective fever. L ear pain. Son and grandkids with similar symptoms. Flonase without relief. Benadryl with some relief though makes sleepy.   Thyroid - has questions about recent thyroid US results.   OBJECTIVE:   BP 127/62 (BP Location: Left Arm, Patient Position: Sitting)   Pulse 61   Ht 5\' 9"  (1.753 m)   Wt 209 lb (94.8 kg)   SpO2 100%   BMI 30.86 kg/m   Gen: well appearing, in NAD HEENT: orophyarynx clear without exudate or erythema. Uvula midline. No tonsillar enlargement. Good dentition. Some congestion and rhinorrhea bilaterally. TTP over bilateral maxillary sinuses. TM visible b/l without bulging, erythema, purulence. No cervical or supraclavicular lymphadenopathy. Card: RRR Lungs: CTAB Ext: WWP, no edema   ASSESSMENT/PLAN:   Problem List Items Addressed This Visit       Genitourinary   CKD (chronic kidney disease) stage 3, GFR 30-59 ml/min (HCC)   Relevant Orders   Basic Metabolic Panel (Completed)     Other   Fatigue - Primary   Unclear etiology. Seems to have good sleep hygiene and no clear signs of OSA. Does have COPD per imaging and not currently on maintenance inhaler with some DOE, possibly contributing. Will obtain labs to assess for other contributors. F/u 1 month, consider maintenance inhaler and PFTs at that time if remains persistent.       Relevant Orders   TSH (Completed)   Vitamin B12  (Completed)   CBC (Completed)   Prediabetes   Relevant Orders   HgB A1c (Completed)   Sinus infection - persistent and not improving. >10 days at this point, reasonable to treat with antibiotics. Rx augmentin.    Thyroid biopsy benign follicular. Prior labs normal. Recommended to repeat US in 12 months given [place] appearance.   Caro Laroche, DO

## 2023-11-08 NOTE — Patient Instructions (Addendum)
 It was great to see you!  Our plans for today:  - We sent an antibiotic to your pharmacy today.  - Come back in about a month.   We are checking some labs today, we will release these results to your MyChart.  Take care and seek immediate care sooner if you develop any concerns.   Dr. Karima Carrell

## 2023-11-09 DIAGNOSIS — R5383 Other fatigue: Secondary | ICD-10-CM | POA: Insufficient documentation

## 2023-11-09 LAB — BASIC METABOLIC PANEL
BUN/Creatinine Ratio: 16 (ref 12–28)
BUN: 16 mg/dL (ref 8–27)
CO2: 25 mmol/L (ref 20–29)
Calcium: 8.8 mg/dL (ref 8.7–10.3)
Chloride: 101 mmol/L (ref 96–106)
Creatinine, Ser: 1.03 mg/dL — ABNORMAL HIGH (ref 0.57–1.00)
Glucose: 84 mg/dL (ref 70–99)
Potassium: 4 mmol/L (ref 3.5–5.2)
Sodium: 141 mmol/L (ref 134–144)
eGFR: 58 mL/min/{1.73_m2} — ABNORMAL LOW (ref >59)

## 2023-11-09 LAB — CBC
Hematocrit: 44 % (ref 34.0–46.6)
Hemoglobin: 14.3 g/dL (ref 11.1–15.9)
MCH: 29.7 pg (ref 26.6–33.0)
MCHC: 32.5 g/dL (ref 31.5–35.7)
MCV: 92 fL (ref 79–97)
Platelets: 240 10*3/uL (ref 150–450)
RBC: 4.81 x10E6/uL (ref 3.77–5.28)
RDW: 12.7 % (ref 11.7–15.4)
WBC: 8.1 10*3/uL (ref 3.4–10.8)

## 2023-11-09 LAB — TSH: TSH: 1.61 u[IU]/mL (ref 0.450–4.500)

## 2023-11-09 LAB — VITAMIN B12: Vitamin B-12: 601 pg/mL (ref 232–1245)

## 2023-11-09 NOTE — Assessment & Plan Note (Signed)
Unclear etiology. Seems to have good sleep hygiene and no clear signs of OSA. Does have COPD per imaging and not currently on maintenance inhaler with some DOE, possibly contributing. Will obtain labs to assess for other contributors. F/u 1 month, consider maintenance inhaler and PFTs at that time if remains persistent.

## 2023-12-10 ENCOUNTER — Other Ambulatory Visit: Payer: Self-pay | Admitting: Cardiology

## 2023-12-15 ENCOUNTER — Other Ambulatory Visit: Payer: Self-pay

## 2023-12-15 MED ORDER — METOPROLOL TARTRATE 25 MG PO TABS: 25.0000 mg | ORAL_TABLET | Freq: Two times a day (BID) | ORAL | 3 refills | Status: AC

## 2024-02-27 ENCOUNTER — Encounter: Payer: Self-pay | Admitting: Family Medicine

## 2024-03-31 ENCOUNTER — Other Ambulatory Visit: Payer: Self-pay | Admitting: Cardiology

## 2024-04-28 ENCOUNTER — Other Ambulatory Visit: Payer: Self-pay | Admitting: Family Medicine

## 2024-04-28 ENCOUNTER — Other Ambulatory Visit: Payer: Self-pay | Admitting: Cardiology

## 2024-04-28 DIAGNOSIS — E7800 Pure hypercholesterolemia, unspecified: Secondary | ICD-10-CM

## 2024-05-01 ENCOUNTER — Telehealth: Payer: Self-pay | Admitting: Cardiology

## 2024-05-01 MED ORDER — HYDROCHLOROTHIAZIDE 12.5 MG PO CAPS: 12.5000 mg | ORAL_CAPSULE | Freq: Every day | ORAL | 0 refills | Status: DC

## 2024-05-01 NOTE — Telephone Encounter (Signed)
*  STAT* If patient is at the pharmacy, call can be transferred to refill team.   1. Which medications need to be refilled? (please list name of each medication and dose if known) hydrochlorothiazide  (MICROZIDE ) 12.5 MG capsule    2. Would you like to learn more about the convenience, safety, & potential cost savings by using the [facility]?    3. Are you open to using the [facility] (Type [facility]. ).   4. Which pharmacy/location (including street and city if local pharmacy) is medication to be sent to? [id number] -  ([place]),  - [address]     5. Do they need a 30 day or 90 day supply? 90 day

## 2024-05-01 NOTE — Telephone Encounter (Signed)
 Pt's medication was sent to pt's pharmacy as requested. Confirmation received.

## 2024-05-13 ENCOUNTER — Other Ambulatory Visit: Payer: Self-pay | Admitting: Family Medicine

## 2024-05-13 DIAGNOSIS — Z1231 Encounter for screening mammogram for malignant neoplasm of breast: Secondary | ICD-10-CM

## 2024-05-16 ENCOUNTER — Ambulatory Visit

## 2024-05-16 VITALS — Ht 69.0 in | Wt 202.0 lb

## 2024-05-16 DIAGNOSIS — Z0000 Encounter for general adult medical examination without abnormal findings: Secondary | ICD-10-CM

## 2024-05-16 NOTE — Patient Instructions (Signed)
 [name] , Thank you for taking time out of your busy schedule to complete your Annual Wellness Visit with me. I enjoyed our conversation and look forward to speaking with you again next year. I, as well as your care team,  appreciate your ongoing commitment to your health goals. Please review the following plan we discussed and let me know if I can assist you in the future. Your Game plan/ To Do List    Referrals: If you haven't heard from the office you've been referred to, please reach out to them at the phone provided.   Follow up Visits: Next Medicare AWV with our clinical staff: 05/19/2025 at 11:10 a.m. phone visit with Nurse Roz   Have you seen your provider in the last 6 months (3 months if uncontrolled diabetes)? Yes Next Office Visit with your provider: Office will call patient to schedule physical for Fall 2025.  Clinician Recommendations:  Aim for 30 minutes of exercise or brisk walking, 6-8 glasses of water, and 5 servings of fruits and vegetables each day.       This is a list of the screening recommended for you and due dates:  Health Maintenance  Topic Date Due   Zoster (Shingles) Vaccine (1 of 2) [birth year]/27/1971   COVID-19 Vaccine (8 - Pfizer risk 2024-25 season) 02/12/2024   Screening for Lung Cancer  05/01/2024   Flu Shot  05/24/2024   Colon Cancer Screening  02/05/2025   Medicare Annual Wellness Visit  05/16/2025   Mammogram  05/28/2025   DTaP/Tdap/Td vaccine (4 - Td or Tdap) 04/20/2033   Pneumococcal Vaccine for age over 49  Completed   DEXA scan (bone density measurement)  Completed   Hepatitis C Screening  Completed   Hepatitis B Vaccine  Aged Out   HPV Vaccine  Aged Out   Meningitis B Vaccine  Aged Out    Advanced directives: (Copy Requested) Please bring a copy of your health care power of attorney and living will to the office to be added to your chart at your convenience. You can mail to [facility] [address] [place], [place] [address]  or email to [email or web address] Advance Care Planning is important because it:  [x]  Makes sure you receive the medical care that is consistent with your values, goals, and preferences  [x]  It provides guidance to your family and loved ones and reduces their decisional burden about whether or not they are making the right decisions based on your wishes.  Follow the link provided in your after visit summary or read over the paperwork we have mailed to you to help you started getting your Advance Directives in place. If you need assistance in completing these, please reach out to us  so that we can help you!  See attachments for Preventive Care and Fall Prevention Tips.

## 2024-05-16 NOTE — Progress Notes (Addendum)
 Because this visit was a virtual/telehealth visit,  certain criteria was not obtained, such a blood pressure, CBG if applicable, and timed get up and go. Any medications not marked as taking were not mentioned during the medication reconciliation part of the visit. Any vitals not documented were not able to be obtained due to this being a telehealth visit or patient was unable to self-report a recent blood pressure reading due to a lack of equipment at home via telehealth. Vitals that have been documented are verbally provided by the patient.   Subjective:   Andrea Robles is a 73 y.o. who presents for a Medicare Wellness preventive visit.  As a reminder, Annual Wellness Visits don't include a physical exam, and some assessments may be limited, especially if this visit is performed virtually. We may recommend an in-person follow-up visit with your provider if needed.  Visit Complete: Virtual I connected with  Andrea Robles on 05/16/24 by a audio enabled telemedicine application and verified that I am speaking with the correct person using two identifiers.  Patient Location: Home  Provider Location: Home Office  I discussed the limitations of evaluation and management by telemedicine. The patient expressed understanding and agreed to proceed.  Vital Signs: Because this visit was a virtual/telehealth visit, some criteria may be missing or patient reported. Any vitals not documented were not able to be obtained and vitals that have been documented are patient reported.  VideoDeclined- This patient declined Librarian, academic. Therefore the visit was completed with audio only.  Persons Participating in Visit: Husband and patient was present during visit.  AWV Questionnaire: No: Patient Medicare AWV questionnaire was not completed prior to this visit.  Cardiac Risk Factors include: advanced age (>62men, >23 women);dyslipidemia;hypertension;sedentary lifestyle      Objective:    Today's Vitals   05/16/24 1115  Weight: 202 lb (91.6 kg)  Height: 5' 9 (1.753 m)  PainSc: 0-No pain   Body mass index is 29.83 kg/m.     05/16/2024   11:16 AM 05/05/2023    6:18 PM 04/21/2023   10:43 AM 10/13/2022    1:42 PM 07/21/2022   12:04 PM 06/20/2022   11:01 AM 05/30/2022   11:22 PM  Advanced Directives  Does Patient Have a Medical Advance Directive? Yes No No No No No No  Type of Estate agent of [place];Living will        Copy of Healthcare Power of Attorney in Chart? No - copy requested        Would patient like information on creating a medical advance directive?  Yes (MAU/Ambulatory/Procedural Areas - Information given)  No - Patient declined No - Patient declined      Current Medications (verified) Outpatient Encounter Medications as of 05/16/2024  Medication Sig   atorvastatin  (LIPITOR) 20 MG tablet Take 1 tablet by mouth once daily   cetirizine (ZYRTEC) 10 MG tablet Take 10 mg by mouth daily.   diphenhydrAMINE (BENADRYL) 50 MG tablet Take 50 mg by mouth at bedtime as needed for allergies.   fluticasone  (FLONASE ) 50 MCG/ACT nasal spray Place 1 spray into both nostrils daily.   hydrochlorothiazide  (MICROZIDE ) 12.5 MG capsule Take 1 capsule (12.5 mg total) by mouth daily.   LORazepam  (ATIVAN ) 0.5 MG tablet Take 1 tablet (0.5 mg total) by mouth daily as needed. for anxiety   losartan  (COZAAR ) 100 MG tablet Take 1 tablet (100 mg total) by mouth at bedtime.   metoprolol  tartrate (LOPRESSOR ) 25 MG  tablet Take 1 tablet (25 mg total) by mouth 2 (two) times daily.   No facility-administered encounter medications on file as of 05/16/2024.    Allergies (verified) Statins, Colcrys  [colchicine ], Hydrocodone , Flagyl [metronidazole], Flexeril  Gardner.Gartner ], Pamelor  [nortriptyline ], and Pepcid [famotidine]   History: Past Medical History:  Diagnosis Date   Allergy    Hypercholesteremia    Hypertension    Past Surgical History:   Procedure Laterality Date   ABDOMINAL HYSTERECTOMY     CHOLECYSTECTOMY     COLONOSCOPY  01/19/2015   LEFT HEART CATH AND CORONARY ANGIOGRAPHY N/A 05/31/2022   Procedure: LEFT HEART CATH AND CORONARY ANGIOGRAPHY;  Surgeon: Claudene Victory ORN, MD;  Location: [facility];  Service: Cardiovascular;  Laterality: N/A;   POLYPECTOMY     Family History  Problem Relation Age of Onset   Colon cancer Sister 64   Lung cancer Sister    Colon cancer Brother 62       METS   Other Maternal Grandfather        ? colon cancer seen on 2005 colon report   Colon cancer Maternal Grandfather    Esophageal cancer Neg Hx    Rectal cancer Neg Hx    Stomach cancer Neg Hx    Social History   Socioeconomic History   Marital status: Married    Spouse name: Not on file   Number of children: Not on file   Years of education: Not on file   Highest education level: Not on file  Occupational History   Not on file  Tobacco Use   Smoking status: Former    Current packs/day: 0.00    Average packs/day: 0.5 packs/day for 44.0 years (22.0 ttl pk-yrs)    Types: Cigarettes    Start date: 03/27/1968    Quit date: 03/27/2012    Years since quitting: 12.1   Smokeless tobacco: Never  Vaping Use   Vaping status: Never Used  Substance and Sexual Activity   Alcohol use: Yes    Comment: ocassionally-rare   Drug use: No   Sexual activity: Never  Other Topics Concern   Not on file  Social History Narrative   Not on file   Social Drivers of Health   Financial Resource Strain: Low Risk  (05/16/2024)   Overall Financial Resource Strain (CARDIA)    Difficulty of Paying Living Expenses: Not hard at all  Food Insecurity: No Food Insecurity (05/16/2024)   Hunger Vital Sign    Worried About Running Out of Food in the Last Year: Never true    Ran Out of Food in the Last Year: Never true  Transportation Needs: No Transportation Needs (05/16/2024)   PRAPARE - Administrator, Civil Service (Medical): No    Lack  of Transportation (Non-Medical): No  Physical Activity: Insufficiently Active (05/16/2024)   Exercise Vital Sign    Days of Exercise per Week: 3 days    Minutes of Exercise per Session: 30 min  Stress: No Stress Concern Present (05/16/2024)   [employer] of Occupational Health - Occupational Stress Questionnaire    Feeling of Stress: Not at all  Social Connections: Moderately Integrated (05/16/2024)   Social Connection and Isolation Panel    Frequency of Communication with Friends and Family: More than three times a week    Frequency of Social Gatherings with Friends and Family: Three times a week    Attends Religious Services: More than 4 times per year    Active Member of Clubs or  Organizations: No    Attends Engineer, structural: Never    Marital Status: Married    Tobacco Counseling Counseling given: Not Answered    Clinical Intake:  Pre-visit preparation completed: Yes  Pain : No/denies pain Pain Score: 0-No pain     BMI - recorded: 29.83 Nutritional Status: BMI 25 -29 Overweight Nutritional Risks: None Diabetes: No  Lab Results  Component Value Date   HGBA1C 6.7 11/08/2023   HGBA1C 6.3 08/14/2023   HGBA1C 6.6 (H) 04/21/2023     How often do you need to have someone help you when you read instructions, pamphlets, or other written materials from your doctor or pharmacy?: 1 - Never  Interpreter Needed?: No  Information entered by :: Natsuko Kelsay N. Marlowe Lawes, LPN.   Activities of Daily Living     05/16/2024   11:45 AM  In your present state of health, do you have any difficulty performing the following activities:  Hearing? 0  Vision? 0  Difficulty concentrating or making decisions? 0  Walking or climbing stairs? 0  Dressing or bathing? 0  Doing errands, shopping? 0  Preparing Food and eating ? N  Using the Toilet? N  In the past six months, have you accidently leaked urine? N  Do you have problems with loss of bowel control? N  Managing your  Medications? N  Managing your Finances? N  Housekeeping or managing your Housekeeping? N    Patient Care Team: Madelon Donald HERO, DO as PCP - General (Family Medicine) Pietro Redell RAMAN, MD as PCP - Cardiology (Cardiology) [place], [facility] [place], [place] as Referring Physician (Optometry) Weyman Corning, RN as [facility] Care Management  I have updated your Care Teams any recent Medical Services you may have received from other providers in the past year.     Assessment:   This is a routine wellness examination for Andrea Robles.  Hearing/Vision screen Hearing Screening - Comments:: Denies hearing difficulties.  Vision Screening - Comments:: Wears otc reading glasses - up to date with routine eye exams with My China Le, OD.    Goals Addressed             This Visit's Progress    05/16/2024: To keep maintaining the best I can.         Depression Screen     05/16/2024   11:20 AM 11/08/2023    1:56 PM 08/14/2023   10:09 AM 05/05/2023    6:17 PM 04/21/2023   10:42 AM 10/13/2022    1:41 PM 07/21/2022   12:19 PM  PHQ 2/9 Scores  PHQ - 2 Score 0 0 0 0 0 0 0  PHQ- 9 Score 0  0 0 0 0 0    Fall Risk     05/16/2024   11:17 AM 05/05/2023    6:18 PM 04/21/2023   10:42 AM 10/13/2022    1:41 PM 07/21/2022   12:23 PM  Fall Risk   Falls in the past year? 0 0 0 0 0  Number falls in past yr: 0 0 0 0   Injury with Fall? 0 0 0 0   Risk for fall due to : No Fall Risks No Fall Risks     Follow up Falls evaluation completed Falls prevention discussed;Education provided;Falls evaluation completed       MEDICARE RISK AT HOME:  Medicare Risk at Home Any stairs in or around the home?: Yes (Front Entrance-4 steps, has a ramp) If so, are there any without handrails?:  No Home free of loose throw rugs in walkways, pet beds, electrical cords, etc?: Yes Adequate lighting in your home to reduce risk of falls?: Yes Life alert?: No Use of a cane, walker or w/c?: No Grab bars in the bathroom?: Yes Shower  chair or bench in shower?: No Elevated toilet seat or a handicapped toilet?: Yes  TIMED UP AND GO:  Was the test performed?  No  Cognitive Function: Declined/Normal: No cognitive concerns noted by patient or family. Patient alert, oriented, able to answer questions appropriately and recall recent events. No signs of memory loss or confusion.    05/16/2024   11:20 AM  MMSE - Mini Mental State Exam  Not completed: Unable to complete        05/16/2024   11:16 AM 05/05/2023    6:18 PM  6CIT Screen  What Year? 0 points 0 points  What month? 0 points 0 points  What time? 0 points 0 points  Count back from 20 0 points 0 points  Months in reverse 0 points 0 points  Repeat phrase 0 points 0 points  Total Score 0 points 0 points    Immunizations Immunization History  Administered Date(s) Administered   Fluad Quad(high Dose 65+) 07/29/2020, 07/15/2021, 07/21/2022, 08/14/2023   Influenza Split 08/03/2012   Influenza Whole 07/27/2007, 08/01/2008   Influenza,inj,Quad PF,6+ Mos 07/08/2013, 07/09/2014, 07/13/2016, 07/26/2017, 07/17/2019   Influenza-Unspecified 07/28/2015, 07/18/2018   PFIZER Comirnaty(Gray Top)Covid-19 Tri-Sucrose Vaccine 03/17/2021   PFIZER(Purple Top)SARS-COV-2 Vaccination 11/28/2019, 12/19/2019, 07/29/2020   Pfizer Covid-19 Vaccine Bivalent Booster 28yrs & up 10/11/2021   Pfizer(Comirnaty)Fall Seasonal Vaccine 12 years and older 08/26/2022, 08/14/2023   Pneumococcal Conjugate-13 04/27/2017   Pneumococcal Polysaccharide-23 09/23/2000, 06/27/2008, 10/03/2018   RSV,unspecified 10/04/2022   Td 10/24/2002   Tdap 01/25/2013, 04/21/2023   Zoster, Live 10/27/2014    Screening Tests Health Maintenance  Topic Date Due   Zoster Vaccines- Shingrix (1 of 2) 03/27/1971   COVID-19 Vaccine (8 - Pfizer risk 2024-25 season) 02/12/2024   Lung Cancer Screening  05/01/2024   INFLUENZA VACCINE  05/24/2024   Colonoscopy  02/05/2025   Medicare Annual Wellness (AWV)  05/16/2025    MAMMOGRAM  05/28/2025   DTaP/Tdap/Td (4 - Td or Tdap) 04/20/2033   Pneumococcal Vaccine: 50+ Years  Completed   DEXA SCAN  Completed   Hepatitis C Screening  Completed   Hepatitis B Vaccines  Aged Out   HPV VACCINES  Aged Out   Meningococcal B Vaccine  Aged Out    Health Maintenance  Health Maintenance Due  Topic Date Due   Zoster Vaccines- Shingrix (1 of 2) 03/27/1971   COVID-19 Vaccine (8 - Pfizer risk 2024-25 season) 02/12/2024   Lung Cancer Screening  05/01/2024   Health Maintenance Items Addressed: Yes Patient aware of current care gaps.  Immunization record was verified by [employer].  Patient is due for the following: Lung Cancer Screening and Covid-19 vaccine.  Patient declined Shingrix vaccine due to last reaction.  Additional Screening:  Vision Screening: Recommended annual ophthalmology exams for early detection of glaucoma and other disorders of the eye. Would you like a referral to an eye doctor? No    Dental Screening: Recommended annual dental exams for proper oral hygiene  Community Resource Referral / Chronic Care Management: CRR required this visit?  No   CCM required this visit?  No   Plan:    I have personally reviewed and noted the following in the patient's chart:   Medical and social history Use of  alcohol, tobacco or illicit drugs  Current medications and supplements including opioid prescriptions. Patient is not currently taking opioid prescriptions. Functional ability and status Nutritional status Physical activity Advanced directives List of other physicians Hospitalizations, surgeries, and ER visits in previous 12 months Vitals Screenings to include cognitive, depression, and falls Referrals and appointments  In addition, I have reviewed and discussed with patient certain preventive protocols, quality metrics, and best practice recommendations. A written personalized care plan for preventive services as well as general preventive health  recommendations were provided to patient.   Roz LOISE Fuller, LPN   2/75/7974   After Visit Summary: (MyChart) Due to this being a telephonic visit, the after visit summary with patients personalized plan was offered to patient via MyChart   Notes: Patient aware of current care gaps.  Immunization record was verified by [employer].  Patient is due for the following: Lung Cancer Screening and Covid-19 vaccine.  Patient declined Shingrix vaccine due to last reaction.

## 2024-05-29 ENCOUNTER — Ambulatory Visit
Admission: RE | Admit: 2024-05-29 | Discharge: 2024-05-29 | Disposition: A | Discharge status: Home / Self Care | Source: Ambulatory Visit | Attending: Family Medicine | Admitting: Family Medicine

## 2024-05-29 DIAGNOSIS — Z1231 Encounter for screening mammogram for malignant neoplasm of breast: Secondary | ICD-10-CM

## 2024-06-11 ENCOUNTER — Other Ambulatory Visit: Payer: Self-pay

## 2024-06-11 DIAGNOSIS — I10 Essential (primary) hypertension: Secondary | ICD-10-CM

## 2024-06-11 MED ORDER — LOSARTAN POTASSIUM 100 MG PO TABS: 100.0000 mg | ORAL_TABLET | Freq: Every day | ORAL | 3 refills | Status: DC

## 2024-06-17 ENCOUNTER — Other Ambulatory Visit: Payer: Self-pay | Admitting: Family Medicine

## 2024-06-17 DIAGNOSIS — I10 Essential (primary) hypertension: Secondary | ICD-10-CM

## 2024-06-23 ENCOUNTER — Other Ambulatory Visit: Payer: Self-pay | Admitting: Family Medicine

## 2024-06-23 DIAGNOSIS — I10 Essential (primary) hypertension: Secondary | ICD-10-CM

## 2024-06-25 ENCOUNTER — Telehealth: Payer: Self-pay

## 2024-06-25 DIAGNOSIS — I10 Essential (primary) hypertension: Secondary | ICD-10-CM

## 2024-06-25 MED ORDER — LOSARTAN POTASSIUM 100 MG PO TABS: 100.0000 mg | ORAL_TABLET | Freq: Every day | ORAL | 3 refills | Status: AC

## 2024-06-25 NOTE — Telephone Encounter (Signed)
 Patient calls nurse regarding losartan  prescription.   She reports that rx was supposed to be sent to [facility] pharmacy on [address].   Called and cancelled at [facility].  Resent to [facility] pharmacy per patient request.   Chiquita JAYSON English, RN

## 2024-06-27 NOTE — Telephone Encounter (Signed)
 Refilled 2 days ago for 1 year supply

## 2024-07-05 ENCOUNTER — Encounter: Payer: Self-pay | Admitting: Family Medicine

## 2024-07-05 ENCOUNTER — Ambulatory Visit: Admitting: Family Medicine

## 2024-07-05 VITALS — BP 129/73 | HR 60 | Temp 97.9°F | Ht 69.0 in | Wt 203.0 lb

## 2024-07-05 DIAGNOSIS — M5430 Sciatica, unspecified side: Secondary | ICD-10-CM | POA: Diagnosis not present

## 2024-07-05 DIAGNOSIS — Z87891 Personal history of nicotine dependence: Secondary | ICD-10-CM

## 2024-07-05 DIAGNOSIS — F419 Anxiety disorder, unspecified: Secondary | ICD-10-CM | POA: Diagnosis not present

## 2024-07-05 DIAGNOSIS — R7303 Prediabetes: Secondary | ICD-10-CM | POA: Diagnosis not present

## 2024-07-05 DIAGNOSIS — R911 Solitary pulmonary nodule: Secondary | ICD-10-CM

## 2024-07-05 DIAGNOSIS — Z23 Encounter for immunization: Secondary | ICD-10-CM

## 2024-07-05 DIAGNOSIS — B9689 Other specified bacterial agents as the cause of diseases classified elsewhere: Secondary | ICD-10-CM | POA: Diagnosis not present

## 2024-07-05 DIAGNOSIS — R051 Acute cough: Secondary | ICD-10-CM | POA: Diagnosis not present

## 2024-07-05 DIAGNOSIS — E041 Nontoxic single thyroid nodule: Secondary | ICD-10-CM

## 2024-07-05 DIAGNOSIS — J449 Chronic obstructive pulmonary disease, unspecified: Secondary | ICD-10-CM | POA: Diagnosis not present

## 2024-07-05 DIAGNOSIS — Z0000 Encounter for general adult medical examination without abnormal findings: Secondary | ICD-10-CM | POA: Diagnosis not present

## 2024-07-05 DIAGNOSIS — M549 Dorsalgia, unspecified: Secondary | ICD-10-CM

## 2024-07-05 LAB — POCT GLYCOSYLATED HEMOGLOBIN (HGB A1C): Hemoglobin A1C: 6.3 % — AB (ref 4.0–5.6)

## 2024-07-05 LAB — POC SOFIA 2 FLU + SARS ANTIGEN FIA
Influenza A, POC: NEGATIVE
Influenza B, POC: NEGATIVE
SARS Coronavirus 2 Ag: NEGATIVE

## 2024-07-05 MED ORDER — LORAZEPAM 0.5 MG PO TABS: 0.5000 mg | ORAL_TABLET | Freq: Every day | ORAL | 0 refills | Status: DC | PRN

## 2024-07-05 MED ORDER — COVID-19 MRNA VAC-TRIS(PFIZER) 30 MCG/0.3ML IM SUSY: 0.3000 mL | PREFILLED_SYRINGE | Freq: Once | INTRAMUSCULAR | 0 refills | Status: AC

## 2024-07-05 MED ORDER — GABAPENTIN 300 MG PO CAPS: 300.0000 mg | ORAL_CAPSULE | Freq: Every day | ORAL | 0 refills | Status: AC

## 2024-07-05 MED ORDER — COVID-19 MRNA VAC-TRIS(PFIZER) 30 MCG/0.3ML IM SUSY: 0.3000 mL | PREFILLED_SYRINGE | Freq: Once | INTRAMUSCULAR | 0 refills | Status: DC

## 2024-07-05 MED ORDER — AMOXICILLIN-POT CLAVULANATE 875-125 MG PO TABS: 1.0000 | ORAL_TABLET | Freq: Two times a day (BID) | ORAL | 0 refills | Status: AC

## 2024-07-05 NOTE — Progress Notes (Signed)
 SUBJECTIVE:   CHIEF COMPLAINT / HPI:   Andrea Robles is a 73 y.o. female presenting on 07/05/2024 for comprehensive medical examination. Current medical complaints include: back pain  Back pain  - previusly saw Dr. Althia. Worse with standing long periods. Sometimes with sciactic pain. Bothers with sitting long periods. No PT before. Does home exercises. Lumbar XR with mild facet joint hypertrophy L5-S1.  Depression Screen done today and results listed below:     07/05/2024   11:44 AM 05/16/2024   11:20 AM 11/08/2023    1:56 PM 08/14/2023   10:09 AM 05/05/2023    6:17 PM  Depression screen PHQ 2/9  Decreased Interest 0 0 0 0 0  Down, Depressed, Hopeless 1 0 0 0 0  PHQ - 2 Score 1 0 0 0 0  Altered sleeping 0 0  0 0  Tired, decreased energy 3 0  0 0  Change in appetite 0 0  0 0  Feeling bad or failure about yourself  0 0  0 0  Trouble concentrating 0 0  0 0  Moving slowly or fidgety/restless 0 0  0 0  Suicidal thoughts 0 0  0 0  PHQ-9 Score 4 0  0 0  Difficult doing work/chores  Not difficult at all  Not difficult at all     Past medical history, surgical history, medications, allergies, family history and social history reviewed with patient today and changes made to appropriate areas of the chart.      Objective:    BP 129/73   Pulse 60   Temp 97.9 F (36.6 C) (Oral)   Ht 5' 9 (1.753 m)   Wt 203 lb (92.1 kg)   SpO2 99%   BMI 29.98 kg/m   Wt Readings from Last 3 Encounters:  07/05/24 203 lb (92.1 kg)  05/16/24 202 lb (91.6 kg)  11/08/23 209 lb (94.8 kg)    Physical Exam Constitutional:      General: She is not in acute distress.    Appearance: Normal appearance. She is not toxic-appearing.  HENT:     Head: Normocephalic and atraumatic.     Right Ear: Tympanic membrane, ear canal and external ear normal.     Left Ear: Tympanic membrane, ear canal and external ear normal.     Nose: Congestion present.     Comments: TTP over b/l maxillary and frontal  sinuses    Mouth/Throat:     Mouth: Mucous membranes are moist.     Pharynx: Oropharynx is clear.  Eyes:     Pupils: Pupils are equal, round, and reactive to light.  Cardiovascular:     Rate and Rhythm: Normal rate and regular rhythm.     Heart sounds: Normal heart sounds. No murmur heard. Pulmonary:     Effort: Pulmonary effort is normal. No respiratory distress.     Breath sounds: Normal breath sounds.  Abdominal:     General: Bowel sounds are normal.     Palpations: Abdomen is soft.     Tenderness: There is no abdominal tenderness.  Musculoskeletal:        General: Normal range of motion.     Cervical back: Normal range of motion.     Right lower leg: No edema.     Left lower leg: No edema.  Lymphadenopathy:     Cervical: No cervical adenopathy.  Skin:    General: Skin is warm and dry.  Neurological:     Mental Status: She is  alert and oriented to person, place, and time. Mental status is at baseline.     Gait: Gait normal.  Psychiatric:        Mood and Affect: Mood normal.        Behavior: Behavior normal.         Assessment & Plan:   Problem List Items Addressed This Visit       Respiratory   COPD (chronic obstructive pulmonary disease) (HCC)   Pulmonary nodule   Relevant Orders   CT CHEST LUNG CA SCREEN LOW DOSE W/O CM     Endocrine   Thyroid  nodule   Due for f/u US  07/2024        Nervous and Auditory   Back pain with sciatica   Longstanding history. Reviewed prior lumbar films. Will order MRI, needs open MRI machine, will order once clarify location. Rx gabapentin .      Relevant Medications   LORazepam  (ATIVAN ) 0.5 MG tablet   gabapentin  (NEURONTIN ) 300 MG capsule     Other   Anxiety   Relevant Medications   LORazepam  (ATIVAN ) 0.5 MG tablet   Prediabetes   Quit smoking   Relevant Orders   CT CHEST LUNG CA SCREEN LOW DOSE W/O CM   Other Visit Diagnoses       Acute cough    -  Primary   Relevant Orders   POC SOFIA 2 FLU + SARS ANTIGEN  FIA (Completed)     Annual physical exam       Relevant Medications   COVID-19 mRNA vaccine, Pfizer, (COMIRNATY) syringe   Other Relevant Orders   CT CHEST LUNG CA SCREEN LOW DOSE W/O CM   DG Bone Density   HgB A1c (Completed)   Flu vaccine HIGH DOSE PF(Fluzone Trivalent) (Completed)     Encounter for immunization         Bacterial sinusitis       Relevant Medications   amoxicillin -clavulanate (AUGMENTIN ) 875-125 MG tablet   COVID-19 mRNA vaccine, Pfizer, (COMIRNATY) syringe        Follow up plan: Return in about 3 months (around 10/04/2024) for back pain.   LABORATORY TESTING:  - Pap smear: not applicable  IMMUNIZATIONS:   - Tdap: Tetanus vaccination status reviewed: last tetanus booster within 10 years. - Influenza: Administered today - Pneumococcal: Up to date - HPV: Not applicable - Shingrix vaccine: Declined - COVID vaccine: due, Rx given  SCREENING: - Mammogram: Up to date  - Colonoscopy: Up to date  - Bone Density: Ordered today  - Lung Cancer Screening: Ordered today   Hep C Screening: UTD  NEXT PREVENTATIVE PHYSICAL DUE IN 1 YEAR. Return in about 3 months (around 10/04/2024) for back pain.   Donald CHRISTELLA Lai, DO

## 2024-07-05 NOTE — Assessment & Plan Note (Signed)
 Longstanding history. Reviewed prior lumbar films. Will order MRI, needs open MRI machine, will order once clarify location. Rx gabapentin .

## 2024-07-05 NOTE — Patient Instructions (Addendum)
 It was great to see you!  Our plans for today:  - Someone will call you to schedule the MRI and CT scans and bone density scan. The thyroid  ultrasound will be next month. - take the antibiotic for 5 days.  - try the gabapentin  for your back.   We are checking some labs today, we will release these results to your MyChart.  Take care and seek immediate care sooner if you develop any concerns.   Dr. Willamae Demby

## 2024-07-05 NOTE — Assessment & Plan Note (Signed)
 Due for f/u US  07/2024

## 2024-07-08 ENCOUNTER — Ambulatory Visit: Payer: Self-pay | Admitting: Family Medicine

## 2024-07-10 ENCOUNTER — Encounter: Payer: Self-pay | Admitting: Family Medicine

## 2024-07-10 ENCOUNTER — Telehealth: Payer: Self-pay

## 2024-07-10 DIAGNOSIS — R911 Solitary pulmonary nodule: Secondary | ICD-10-CM

## 2024-07-10 DIAGNOSIS — M5430 Sciatica, unspecified side: Secondary | ICD-10-CM

## 2024-07-10 NOTE — Telephone Encounter (Signed)
 CT chest order placed for [facility].  MRI ordered.

## 2024-07-10 NOTE — Telephone Encounter (Signed)
 MRI order placed, needs open MRI. Order is for [facility]. Let me know if I need to change the order if need a different location for open MRI.  Bone density order previously placed for [facility].

## 2024-07-10 NOTE — Addendum Note (Signed)
 Addended by: MADELON DONALD HERO on: 07/10/2024 12:23 PM   Modules accepted: Orders

## 2024-07-10 NOTE — Telephone Encounter (Signed)
 Called centralized scheduling and spoke with Peggy to schedule a CT appointment for patient.  Scheduled it for 07/15/24 at [facility] at 5pm. Patient must arrive at 4:45pm.  Called patient to inform her about about her CT appointment. Patient stated that she did not want the CT to [facility] and why couldn't she get it done at [facility], because her husband was getting his done at [facility].  Did inform patient it is where the insurance approved it for. Patient then proceeded to ask what her co pay will be. Informed patient that I'm not aware of the co payments. Patient wanted the centralized scheduling to ask about co payments and to cancel appointment if needed. Provided patient with Centralize Scheduling number.  Patient stated that she will message Dr. Madelon to figure out when will Dr. Madelon place the Open MRI order, and to send CT to [facility] instead.  Harlene Reiter, CMA

## 2024-07-10 NOTE — Telephone Encounter (Signed)
-----   Message from [employer] Reena S sent at 07/10/2024 10:10 AM EDT ----- Walterine!    Ok to schedule CT at a [facility] facility.  Thanks! Margit

## 2024-07-15 ENCOUNTER — Ambulatory Visit (HOSPITAL_COMMUNITY)
Admission: RE | Admit: 2024-07-15 | Discharge: 2024-07-15 | Disposition: A | Discharge status: Home / Self Care | Source: Ambulatory Visit | Attending: Family Medicine | Admitting: Family Medicine

## 2024-07-15 DIAGNOSIS — Z0000 Encounter for general adult medical examination without abnormal findings: Secondary | ICD-10-CM | POA: Diagnosis not present

## 2024-07-15 DIAGNOSIS — R911 Solitary pulmonary nodule: Secondary | ICD-10-CM | POA: Diagnosis not present

## 2024-07-15 DIAGNOSIS — Z87891 Personal history of nicotine dependence: Secondary | ICD-10-CM | POA: Insufficient documentation

## 2024-07-22 NOTE — Progress Notes (Unsigned)
 HPI: Follow-up palpitations, hypertension, hyperlipidemia and family history of coronary artery disease. CTA August 2023 showed no pulmonary embolus, 5 mm subpleural pulmonary nodule in the right lower lobe and follow-up recommended in 12 months if patient high risk, thyroid  nodule and ultrasound recommended. ABIs August 2023 mild on the left. Echocardiogram August 2023 showed normal LV function, grade 1 diastolic dysfunction, mild mitral regurgitation. Cardiac catheterization August 2023 showed normal coronary arteries and left ventricular end-diastolic pressure of 14 mmHg. Monitor September 2023 showed sinus rhythm with occasional PAC, brief runs of PAT, rare PVC and couplet. Follow-up thyroid  ultrasound October 2023 showed 1.5 cm nodule on the left and follow-up recommended 1 year. Since last seen   Current Outpatient Medications  Medication Sig Dispense Refill   atorvastatin  (LIPITOR) 20 MG tablet Take 1 tablet by mouth once daily 90 tablet 3   cetirizine (ZYRTEC) 10 MG tablet Take 10 mg by mouth daily.     diphenhydrAMINE (BENADRYL) 50 MG tablet Take 50 mg by mouth at bedtime as needed for allergies.     fluticasone  (FLONASE ) 50 MCG/ACT nasal spray Place 1 spray into both nostrils daily. 16 g 6   gabapentin  (NEURONTIN ) 300 MG capsule Take 1 capsule (300 mg total) by mouth at bedtime. 90 capsule 0   hydrochlorothiazide  (MICROZIDE ) 12.5 MG capsule Take 1 capsule (12.5 mg total) by mouth daily. 90 capsule 0   LORazepam  (ATIVAN ) 0.5 MG tablet Take 1 tablet (0.5 mg total) by mouth daily as needed. for anxiety 20 tablet 0   losartan  (COZAAR ) 100 MG tablet Take 1 tablet (100 mg total) by mouth at bedtime. 90 tablet 3   metoprolol  tartrate (LOPRESSOR ) 25 MG tablet Take 1 tablet (25 mg total) by mouth 2 (two) times daily. 180 tablet 3   No current facility-administered medications for this visit.     Past Medical History:  Diagnosis Date   Allergy    Hypercholesteremia    Hypertension      Past Surgical History:  Procedure Laterality Date   ABDOMINAL HYSTERECTOMY     CHOLECYSTECTOMY     COLONOSCOPY  01/19/2015   LEFT HEART CATH AND CORONARY ANGIOGRAPHY N/A 05/31/2022   Procedure: LEFT HEART CATH AND CORONARY ANGIOGRAPHY;  Surgeon: Claudene Victory ORN, MD;  Location: [facility];  Service: Cardiovascular;  Laterality: N/A;   POLYPECTOMY      Social History   Socioeconomic History   Marital status: Married    Spouse name: Not on file   Number of children: Not on file   Years of education: Not on file   Highest education level: Not on file  Occupational History   Not on file  Tobacco Use   Smoking status: Former    Current packs/day: 0.00    Average packs/day: 0.5 packs/day for 44.0 years (22.0 ttl pk-yrs)    Types: Cigarettes    Start date: 03/27/1968    Quit date: 03/27/2012    Years since quitting: 12.3   Smokeless tobacco: Never  Vaping Use   Vaping status: Never Used  Substance and Sexual Activity   Alcohol use: Yes    Comment: ocassionally-rare   Drug use: No   Sexual activity: Never  Other Topics Concern   Not on file  Social History Narrative   Not on file   Social Drivers of Health   Financial Resource Strain: Low Risk  (05/16/2024)   Overall Financial Resource Strain (CARDIA)    Difficulty of Paying Living Expenses: Not hard  at all  Food Insecurity: No Food Insecurity (05/16/2024)   Hunger Vital Sign    Worried About Running Out of Food in the Last Year: Never true    Ran Out of Food in the Last Year: Never true  Transportation Needs: No Transportation Needs (05/16/2024)   PRAPARE - Administrator, Civil Service (Medical): No    Lack of Transportation (Non-Medical): No  Physical Activity: Insufficiently Active (05/16/2024)   Exercise Vital Sign    Days of Exercise per Week: 3 days    Minutes of Exercise per Session: 30 min  Stress: No Stress Concern Present (05/16/2024)   [employer] of Occupational Health - Occupational  Stress Questionnaire    Feeling of Stress: Not at all  Social Connections: Moderately Integrated (05/16/2024)   Social Connection and Isolation Panel    Frequency of Communication with Friends and Family: More than three times a week    Frequency of Social Gatherings with Friends and Family: Three times a week    Attends Religious Services: More than 4 times per year    Active Member of Clubs or Organizations: No    Attends Banker Meetings: Never    Marital Status: Married  Catering manager Violence: Not At Risk (05/16/2024)   Humiliation, Afraid, Rape, and Kick questionnaire    Fear of Current or Ex-Partner: No    Emotionally Abused: No    Physically Abused: No    Sexually Abused: No    Family History  Problem Relation Age of Onset   Colon cancer Sister 70   Lung cancer Sister    Colon cancer Brother 56       METS   Other Maternal Grandfather        ? colon cancer seen on 2005 colon report   Colon cancer Maternal Grandfather    Esophageal cancer Neg Hx    Rectal cancer Neg Hx    Stomach cancer Neg Hx     ROS: no fevers or chills, productive cough, hemoptysis, dysphasia, odynophagia, melena, hematochezia, dysuria, hematuria, rash, seizure activity, orthopnea, PND, pedal edema, claudication. Remaining systems are negative.  Physical Exam: Well-developed well-nourished in no acute distress.  Skin is warm and dry.  HEENT is normal.  Neck is supple.  Chest is clear to auscultation with normal expansion.  Cardiovascular exam is regular rate and rhythm.  Abdominal exam nontender or distended. No masses palpated. Extremities show no edema. neuro grossly intact  ECG- personally reviewed  A/P  1 palpitations-symptoms are reasonably well-controlled.  Will continue metoprolol  at present dose.  2 hypertension-blood pressure controlled.  Continue present medical regimen.  3 hyperlipidemia-continue statin.  Redell Shallow, MD

## 2024-07-25 ENCOUNTER — Encounter: Payer: Self-pay | Admitting: Cardiology

## 2024-07-25 ENCOUNTER — Ambulatory Visit: Attending: Cardiology | Admitting: Cardiology

## 2024-07-25 VITALS — BP 126/73 | HR 62 | Ht 69.0 in | Wt 200.0 lb

## 2024-07-25 DIAGNOSIS — E785 Hyperlipidemia, unspecified: Secondary | ICD-10-CM

## 2024-07-25 DIAGNOSIS — I10 Essential (primary) hypertension: Secondary | ICD-10-CM | POA: Diagnosis not present

## 2024-07-25 DIAGNOSIS — R002 Palpitations: Secondary | ICD-10-CM | POA: Diagnosis not present

## 2024-07-25 DIAGNOSIS — E7800 Pure hypercholesterolemia, unspecified: Secondary | ICD-10-CM | POA: Diagnosis not present

## 2024-07-25 MED ORDER — HYDROCHLOROTHIAZIDE 12.5 MG PO CAPS: 12.5000 mg | ORAL_CAPSULE | Freq: Every day | ORAL | 3 refills | Status: AC

## 2024-07-25 NOTE — Patient Instructions (Signed)

## 2024-08-01 ENCOUNTER — Other Ambulatory Visit: Payer: Self-pay | Admitting: Family Medicine

## 2024-08-15 NOTE — Progress Notes (Unsigned)
    SUBJECTIVE:   CHIEF COMPLAINT / HPI:   Discussed the use of AI scribe software for clinical note transcription with the patient, who gave verbal consent to proceed.  History of Present Illness Andrea OELKERS is a 73 year old female who presents with symptoms of a yeast infection following antibiotic use.  Vulvovaginal symptoms - Burning sensation developed after completing antibiotics for sinusitis one month ago - Small bumps were present but have resolved - No vaginal discharge or pruritus - One-day Monistat treatment resulted in a breakout - Frequent bubble baths for back pain, which may contribute to symptoms - No recent sexual activity - Does not wish to be tested for sexually transmitted infections    OBJECTIVE:   BP 138/84   Pulse 60   Ht 5' 9 (1.753 m)   Wt 201 lb 12.8 oz (91.5 kg)   SpO2 98%   BMI 29.80 kg/m   Gen: well appearing, in NAD GYN:  External genitalia with irritated, erythematous skin to vulva and inner thighs. No vesicles, chancres.  Vaginal mucosa pink, moist, normal rugae.  Nonfriable cervix without lesions, no discharge or bleeding noted on speculum exam.    ASSESSMENT/PLAN:   Assessment & Plan Vaginal irritation Wet prep with few bacteria otherwise negative. Irritation likely 2/2 prior yeast infection now resolved vs irritation from monistat. Will trial topical clotrimazole externally, f/u if no improvement. Thyroid  nodule Due for f/u US  07/2024, ordered today. Back pain with sciatica Chronic back pain with sciatica. Awaiting open MRI for further evaluation with [facility] on [address]. Will check with referral coordinator on status of order.      Donald CHRISTELLA Lai, DO

## 2024-08-16 ENCOUNTER — Ambulatory Visit: Admitting: Family Medicine

## 2024-08-16 ENCOUNTER — Encounter: Payer: Self-pay | Admitting: Family Medicine

## 2024-08-16 ENCOUNTER — Ambulatory Visit: Payer: Self-pay | Admitting: Family Medicine

## 2024-08-16 VITALS — BP 138/84 | HR 60 | Ht 69.0 in | Wt 201.8 lb

## 2024-08-16 DIAGNOSIS — M549 Dorsalgia, unspecified: Secondary | ICD-10-CM | POA: Diagnosis not present

## 2024-08-16 DIAGNOSIS — E041 Nontoxic single thyroid nodule: Secondary | ICD-10-CM | POA: Diagnosis not present

## 2024-08-16 DIAGNOSIS — N898 Other specified noninflammatory disorders of vagina: Secondary | ICD-10-CM | POA: Diagnosis not present

## 2024-08-16 DIAGNOSIS — M5430 Sciatica, unspecified side: Secondary | ICD-10-CM

## 2024-08-16 LAB — POCT WET PREP (WET MOUNT)
Clue Cells Wet Prep Whiff POC: NEGATIVE
Trichomonas Wet Prep HPF POC: ABSENT
WBC, Wet Prep HPF POC: NONE SEEN

## 2024-08-16 MED ORDER — CLOTRIMAZOLE 1 % EX CREA: 1.0000 | TOPICAL_CREAM | Freq: Two times a day (BID) | CUTANEOUS | 1 refills | Status: AC

## 2024-08-16 NOTE — Assessment & Plan Note (Addendum)
 Chronic back pain with sciatica. Awaiting open MRI for further evaluation with [facility] on [address]. Will check with referral coordinator on status of order.

## 2024-08-16 NOTE — Assessment & Plan Note (Addendum)
 Due for f/u US  07/2024, ordered today.

## 2024-08-16 NOTE — Patient Instructions (Addendum)
 It was great to see you!  Our plans for today:  - We will let you know the results of your wet prep via MyChart.  - Try the clotrimazole for your irritation. - I reached out to our referral coordinator, we will let you know once we know the status of your MRI. - I ordered your thyroid  ultrasound today. Let us  know if you don't hear about an appointment in the next few weeks.   We are checking some labs today, we will release these results to your MyChart.  Take care and seek immediate care sooner if you develop any concerns.   Dr. Wendelin Reader

## 2024-08-20 ENCOUNTER — Ambulatory Visit
Admission: RE | Admit: 2024-08-20 | Discharge: 2024-08-20 | Disposition: A | Discharge status: Home / Self Care | Source: Ambulatory Visit | Attending: Family Medicine | Admitting: Family Medicine

## 2024-08-20 DIAGNOSIS — E041 Nontoxic single thyroid nodule: Secondary | ICD-10-CM

## 2024-09-25 ENCOUNTER — Encounter: Payer: Self-pay | Admitting: Cardiology

## 2024-09-25 NOTE — Telephone Encounter (Signed)
 Error

## 2024-10-03 ENCOUNTER — Other Ambulatory Visit: Payer: Self-pay | Admitting: Family Medicine

## 2024-10-22 ENCOUNTER — Other Ambulatory Visit: Payer: Self-pay | Admitting: Family Medicine

## 2024-10-22 DIAGNOSIS — F419 Anxiety disorder, unspecified: Secondary | ICD-10-CM

## 2025-05-19 ENCOUNTER — Encounter
